# Patient Record
Sex: Male | Born: 1970 | Race: White | Hispanic: No | Marital: Married | Smoking: Never smoker
Health system: Southern US, Community
[De-identification: ages and names within clinical notes are randomized; demographics above are authoritative.]

## PROBLEM LIST (undated history)

## (undated) DIAGNOSIS — H33312 Horseshoe tear of retina without detachment, left eye: Secondary | ICD-10-CM

## (undated) DIAGNOSIS — R3915 Urgency of urination: Secondary | ICD-10-CM

## (undated) DIAGNOSIS — J45909 Unspecified asthma, uncomplicated: Secondary | ICD-10-CM

## (undated) DIAGNOSIS — H3530 Unspecified macular degeneration: Secondary | ICD-10-CM

## (undated) HISTORY — PX: CATARACT EXTRACTION, BILATERAL: SHX1313

## (undated) HISTORY — PX: EYE SURGERY: SHX253

---

## 2009-04-01 ENCOUNTER — Ambulatory Visit: Payer: Self-pay | Admitting: Family Medicine

## 2009-07-09 ENCOUNTER — Encounter: Admission: RE | Admit: 2009-07-09 | Discharge: 2009-07-09 | Payer: Self-pay | Admitting: Family Medicine

## 2009-07-09 ENCOUNTER — Ambulatory Visit: Payer: Self-pay | Admitting: Family Medicine

## 2014-11-20 ENCOUNTER — Other Ambulatory Visit: Payer: Self-pay | Admitting: Ophthalmology

## 2014-12-05 ENCOUNTER — Encounter (HOSPITAL_COMMUNITY): Payer: Self-pay | Admitting: Pharmacy Technician

## 2014-12-09 ENCOUNTER — Other Ambulatory Visit (HOSPITAL_COMMUNITY): Payer: Self-pay | Admitting: *Deleted

## 2014-12-09 NOTE — Pre-Procedure Instructions (Signed)
Christopher Frederick  12/09/2014   Your procedure is scheduled on:  Wednesday, December 17, 2014 at 12:30 PM.   Report to [facility] Entrance "A" Admitting Office at 10:30 AM.   Call this number if you have problems the morning of surgery: [phone]               Any questions prior to day of surgery, please call [phone] between 8 & 4 PM.    Remember:   Do not eat food or drink liquids after midnight Tuesday, 12/17/14.   Take these medicines the morning of surgery with A SIP OF WATER: Allergy med   Do not wear jewelry.  Do not wear lotions, powders, or cologne. You may wear deodorant.  Men may shave face and neck.  Do not bring valuables to the hospital.  [facility] is not responsible                  for any belongings or valuables.               Contacts, dentures or bridgework may not be worn into surgery.  Leave suitcase in the car. After surgery it may be brought to your room.  For patients admitted to the hospital, discharge time is determined by your                treatment team.               Patients discharged the day of surgery will not be allowed to drive home.    Special Instructions: See "Preparing for Surgery" Instruction sheet.     Please read over the following fact sheets that you were given: Pain Booklet, Coughing and Deep Breathing and Surgical Site Infection Prevention

## 2014-12-09 NOTE — Progress Notes (Signed)
Pre-op orders in EPIC not signed. Called Dr. Eliane DecreePatel's office and spoke with receptionist. She states that Dr. Allena KatzPatel is not in the office this week. Will leave a message for him when he returns.

## 2014-12-10 ENCOUNTER — Encounter (HOSPITAL_COMMUNITY)
Admission: RE | Admit: 2014-12-10 | Discharge: 2014-12-10 | Disposition: A | Discharge status: Home / Self Care | Payer: BC Managed Care – PPO | Source: Ambulatory Visit | Attending: Ophthalmology | Admitting: Ophthalmology

## 2014-12-10 ENCOUNTER — Encounter (HOSPITAL_COMMUNITY): Payer: Self-pay

## 2014-12-10 DIAGNOSIS — Z01812 Encounter for preprocedural laboratory examination: Secondary | ICD-10-CM | POA: Diagnosis not present

## 2014-12-10 HISTORY — DX: Unspecified macular degeneration: H35.30

## 2014-12-10 HISTORY — DX: Urgency of urination: R39.15

## 2014-12-10 HISTORY — DX: Horseshoe tear of retina without detachment, left eye: H33.312

## 2014-12-10 HISTORY — DX: Unspecified asthma, uncomplicated: J45.909

## 2014-12-10 LAB — CBC
HCT: 44.4 % (ref 39.0–52.0)
Hemoglobin: 14.7 g/dL (ref 13.0–17.0)
MCH: 29.7 pg (ref 26.0–34.0)
MCHC: 33.1 g/dL (ref 30.0–36.0)
MCV: 89.7 fL (ref 78.0–100.0)
Platelets: 225 10*3/uL (ref 150–400)
RBC: 4.95 MIL/uL (ref 4.22–5.81)
RDW: 13 % (ref 11.5–15.5)
WBC: 10.1 10*3/uL (ref 4.0–10.5)

## 2014-12-10 NOTE — Pre-Procedure Instructions (Signed)
Christopher Frederick  12/10/2014   Your procedure is scheduled on:  Wednesday, December 17, 2014 at 12:30 PM.   Report to [facility] Entrance "A" Admitting Office at 10:30 AM.   Call this number if you have problems the morning of surgery: [phone]               Any questions prior to day of surgery, please call [phone] between 8 & 4 PM.    Remember:   Do not eat food or drink liquids after midnight Tuesday, 12/17/14.   Take these medicines the morning of surgery with A SIP OF WATER: Allergy med, Oxybutynin (Ditropan)   Do not wear jewelry.  Do not wear lotions, powders, or cologne. You may wear deodorant.  Men may shave face and neck.  Do not bring valuables to the hospital.  [facility] is not responsible                  for any belongings or valuables.               Contacts, dentures or bridgework may not be worn into surgery.  Leave suitcase in the car. After surgery it may be brought to your room.  For patients admitted to the hospital, discharge time is determined by your                treatment team.               Patients discharged the day of surgery will not be allowed to drive home.    Special Instructions: [place] - Preparing for Surgery  Before surgery, you can play an important role.  Because skin is not sterile, your skin needs to be as free of germs as possible.  You can reduce the number of germs on you skin by washing with CHG (chlorahexidine gluconate) soap before surgery.  CHG is an antiseptic cleaner which kills germs and bonds with the skin to continue killing germs even after washing.  Please DO NOT use if you have an allergy to CHG or antibacterial soaps.  If your skin becomes reddened/irritated stop using the CHG and inform your nurse when you arrive at Short Stay.  Do not shave (including legs and underarms) for at least 48 hours prior to the first CHG shower.  You may shave your face.  Please follow these instructions carefully:   1.   Shower with CHG Soap the night before surgery and the                                morning of Surgery.  2.  If you choose to wash your hair, wash your hair first as usual with your       normal shampoo.  3.  After you shampoo, rinse your hair and body thoroughly to remove the                      Shampoo.  4.  Use CHG as you would any other liquid soap.  You can apply chg directly       to the skin and wash gently with scrungie or a clean washcloth.  5.  Apply the CHG Soap to your body ONLY FROM THE NECK DOWN.        Do not use on open wounds or open sores.  Avoid contact with your eyes, ears, mouth and  genitals (private parts).  Wash genitals (private parts) with your normal soap.  6.  Wash thoroughly, paying special attention to the area where your surgery        will be performed.  7.  Thoroughly rinse your body with warm water from the neck down.  8.  DO NOT shower/wash with your normal soap after using and rinsing off       the CHG Soap.  9.  Pat yourself dry with a clean towel.            10.  Wear clean pajamas.            11.  Place clean sheets on your bed the night of your first shower and do not        sleep with pets.  Day of Surgery  Do not apply any lotions the morning of surgery.  Please wear clean clothes to the hospital.       Please read over the following fact sheets that you were given: Pain Booklet, Coughing and Deep Breathing and Surgical Site Infection Prevention

## 2014-12-16 ENCOUNTER — Other Ambulatory Visit: Payer: Self-pay | Admitting: Ophthalmology

## 2014-12-17 MED ORDER — PHENYLEPHRINE HCL 2.5 % OP SOLN
1.0000 [drp] | OPHTHALMIC | Status: AC | PRN
  Administered 2014-12-18 (×3): 1 [drp] via OPHTHALMIC
  Filled 2014-12-17: qty 2

## 2014-12-17 MED ORDER — GATIFLOXACIN 0.5 % OP SOLN
1.0000 [drp] | OPHTHALMIC | Status: AC | PRN
  Administered 2014-12-18 (×3): 1 [drp] via OPHTHALMIC
  Filled 2014-12-17: qty 2.5

## 2014-12-17 MED ORDER — CYCLOPENTOLATE HCL 1 % OP SOLN
1.0000 [drp] | OPHTHALMIC | Status: AC | PRN
  Administered 2014-12-18 (×3): 1 [drp] via OPHTHALMIC
  Filled 2014-12-17: qty 2

## 2014-12-17 NOTE — Progress Notes (Signed)
Patient notified to arrive at 11am. Verbalized understanding

## 2014-12-18 ENCOUNTER — Ambulatory Visit (HOSPITAL_COMMUNITY): Payer: BC Managed Care – PPO | Admitting: Critical Care Medicine

## 2014-12-18 ENCOUNTER — Encounter (HOSPITAL_COMMUNITY)
Admission: RE | Disposition: A | Discharge status: Home / Self Care | Payer: Self-pay | Source: Ambulatory Visit | Attending: Ophthalmology

## 2014-12-18 ENCOUNTER — Encounter (HOSPITAL_COMMUNITY): Payer: Self-pay

## 2014-12-18 ENCOUNTER — Ambulatory Visit (HOSPITAL_COMMUNITY)
Admission: RE | Admit: 2014-12-18 | Discharge: 2014-12-18 | Disposition: A | Discharge status: Home / Self Care | Payer: BC Managed Care – PPO | Source: Ambulatory Visit | Attending: Ophthalmology | Admitting: Ophthalmology

## 2014-12-18 DIAGNOSIS — H338 Other retinal detachments: Secondary | ICD-10-CM | POA: Diagnosis not present

## 2014-12-18 DIAGNOSIS — J45909 Unspecified asthma, uncomplicated: Secondary | ICD-10-CM | POA: Insufficient documentation

## 2014-12-18 DIAGNOSIS — H3530 Unspecified macular degeneration: Secondary | ICD-10-CM | POA: Diagnosis not present

## 2014-12-18 DIAGNOSIS — H3322 Serous retinal detachment, left eye: Secondary | ICD-10-CM

## 2014-12-18 HISTORY — PX: GAS INSERTION: SHX5336

## 2014-12-18 HISTORY — PX: PERFLUORONE INJECTION: SHX5302

## 2014-12-18 HISTORY — PX: GAS/FLUID EXCHANGE: SHX5334

## 2014-12-18 HISTORY — PX: PARS PLANA VITRECTOMY: SHX2166

## 2014-12-18 SURGERY — PARS PLANA VITRECTOMY WITH 25 GAUGE
Anesthesia: General | Site: Eye | Laterality: Left

## 2014-12-18 MED ORDER — BSS PLUS IO SOLN
INTRAOCULAR | Status: AC
  Filled 2014-12-18: qty 500

## 2014-12-18 MED ORDER — GLYCOPYRROLATE 0.2 MG/ML IJ SOLN
INTRAMUSCULAR | Status: DC | PRN
  Administered 2014-12-18: 0.4 mg via INTRAVENOUS

## 2014-12-18 MED ORDER — ONDANSETRON HCL 4 MG/2ML IJ SOLN
INTRAMUSCULAR | Status: DC | PRN
  Administered 2014-12-18: 4 mg via INTRAVENOUS

## 2014-12-18 MED ORDER — ONDANSETRON HCL 4 MG/2ML IJ SOLN: 4.0000 mg | Freq: Once | INTRAMUSCULAR | Status: AC | PRN

## 2014-12-18 MED ORDER — FENTANYL CITRATE 0.05 MG/ML IJ SOLN
INTRAMUSCULAR | Status: DC | PRN
  Administered 2014-12-18: 100 ug via INTRAVENOUS
  Administered 2014-12-18: 150 ug via INTRAVENOUS
  Administered 2014-12-18 (×3): 50 ug via INTRAVENOUS
  Administered 2014-12-18: 100 ug via INTRAVENOUS

## 2014-12-18 MED ORDER — DEXAMETHASONE SODIUM PHOSPHATE 4 MG/ML IJ SOLN
INTRAMUSCULAR | Status: DC | PRN
  Administered 2014-12-18: 4 mg via INTRAVENOUS

## 2014-12-18 MED ORDER — HYPROMELLOSE (GONIOSCOPIC) 2.5 % OP SOLN: OPHTHALMIC | Status: DC | PRN

## 2014-12-18 MED ORDER — 0.9 % SODIUM CHLORIDE (POUR BTL) OPTIME
TOPICAL | Status: DC | PRN
  Administered 2014-12-18: 200 mL

## 2014-12-18 MED ORDER — LACTATED RINGERS IV SOLN
INTRAVENOUS | Status: DC | PRN
  Administered 2014-12-18 (×3): via INTRAVENOUS

## 2014-12-18 MED ORDER — BSS IO SOLN
INTRAOCULAR | Status: AC
  Filled 2014-12-18: qty 15

## 2014-12-18 MED ORDER — EPINEPHRINE HCL 1 MG/ML IJ SOLN
INTRAMUSCULAR | Status: AC
  Filled 2014-12-18: qty 1

## 2014-12-18 MED ORDER — ACETAZOLAMIDE SODIUM 500 MG IJ SOLR
INTRAMUSCULAR | Status: AC
  Filled 2014-12-18: qty 500

## 2014-12-18 MED ORDER — TRIAMCINOLONE ACETONIDE 40 MG/ML IJ SUSP
INTRAMUSCULAR | Status: AC
  Filled 2014-12-18: qty 5

## 2014-12-18 MED ORDER — NEOSTIGMINE METHYLSULFATE 10 MG/10ML IV SOLN
INTRAVENOUS | Status: AC
  Filled 2014-12-18: qty 1

## 2014-12-18 MED ORDER — BSS IO SOLN
INTRAOCULAR | Status: DC | PRN
  Administered 2014-12-18: 15 mL via INTRAOCULAR

## 2014-12-18 MED ORDER — FENTANYL CITRATE 0.05 MG/ML IJ SOLN
INTRAMUSCULAR | Status: AC
  Filled 2014-12-18: qty 5

## 2014-12-18 MED ORDER — LIDOCAINE HCL (CARDIAC) 20 MG/ML IV SOLN
INTRAVENOUS | Status: DC | PRN
  Administered 2014-12-18: 80 mg via INTRAVENOUS

## 2014-12-18 MED ORDER — MIDAZOLAM HCL 2 MG/2ML IJ SOLN
INTRAMUSCULAR | Status: AC
  Filled 2014-12-18: qty 2

## 2014-12-18 MED ORDER — DEXAMETHASONE SODIUM PHOSPHATE 10 MG/ML IJ SOLN
INTRAMUSCULAR | Status: AC
  Filled 2014-12-18: qty 1

## 2014-12-18 MED ORDER — FENTANYL CITRATE 0.05 MG/ML IJ SOLN: 25.0000 ug | INTRAMUSCULAR | Status: DC | PRN

## 2014-12-18 MED ORDER — STERILE WATER FOR INJECTION IJ SOLN
INTRAMUSCULAR | Status: DC | PRN
  Administered 2014-12-18: 200 mL

## 2014-12-18 MED ORDER — BSS PLUS IO SOLN
INTRAOCULAR | Status: DC | PRN
  Administered 2014-12-18: 500 mL

## 2014-12-18 MED ORDER — MIDAZOLAM HCL 5 MG/5ML IJ SOLN
INTRAMUSCULAR | Status: DC | PRN
  Administered 2014-12-18: 2 mg via INTRAVENOUS

## 2014-12-18 MED ORDER — POLYMYXIN B SULFATE 500000 UNITS IJ SOLR
INTRAMUSCULAR | Status: AC
  Filled 2014-12-18: qty 1

## 2014-12-18 MED ORDER — SUCCINYLCHOLINE CHLORIDE 20 MG/ML IJ SOLN
INTRAMUSCULAR | Status: DC | PRN
  Administered 2014-12-18: 100 mg via INTRAVENOUS

## 2014-12-18 MED ORDER — LIDOCAINE HCL 2 % IJ SOLN
INTRAMUSCULAR | Status: AC
Start: 2014-12-18 — End: 2014-12-18
  Filled 2014-12-18: qty 20

## 2014-12-18 MED ORDER — NEOSTIGMINE METHYLSULFATE 10 MG/10ML IV SOLN
INTRAVENOUS | Status: DC | PRN
  Administered 2014-12-18: 3 mg via INTRAVENOUS

## 2014-12-18 MED ORDER — BUPIVACAINE HCL (PF) 0.75 % IJ SOLN
INTRAMUSCULAR | Status: AC
  Filled 2014-12-18: qty 10

## 2014-12-18 MED ORDER — HYPROMELLOSE (GONIOSCOPIC) 2.5 % OP SOLN
OPHTHALMIC | Status: AC
  Filled 2014-12-18: qty 15

## 2014-12-18 MED ORDER — OXYCODONE HCL 5 MG PO TABS
5.0000 mg | ORAL_TABLET | Freq: Once | ORAL | Status: AC | PRN
  Administered 2014-12-18: 5 mg via ORAL

## 2014-12-18 MED ORDER — ROCURONIUM BROMIDE 100 MG/10ML IV SOLN
INTRAVENOUS | Status: DC | PRN
  Administered 2014-12-18: 10 mg via INTRAVENOUS
  Administered 2014-12-18: 20 mg via INTRAVENOUS

## 2014-12-18 MED ORDER — SODIUM CHLORIDE 0.9 % IJ SOLN
INTRAMUSCULAR | Status: AC
Start: 2014-12-18 — End: 2014-12-18
  Filled 2014-12-18: qty 10

## 2014-12-18 MED ORDER — PROPOFOL 10 MG/ML IV BOLUS
INTRAVENOUS | Status: DC | PRN
  Administered 2014-12-18: 200 mg via INTRAVENOUS

## 2014-12-18 MED ORDER — HYALURONIDASE HUMAN 150 UNIT/ML IJ SOLN
INTRAMUSCULAR | Status: AC
  Filled 2014-12-18: qty 1

## 2014-12-18 MED ORDER — OXYCODONE HCL 5 MG/5ML PO SOLN: 5.0000 mg | Freq: Once | ORAL | Status: AC | PRN

## 2014-12-18 MED ORDER — HYPROMELLOSE (GONIOSCOPIC) 2.5 % OP SOLN
OPHTHALMIC | Status: DC | PRN
  Administered 2014-12-18: 5 [drp] via OPHTHALMIC

## 2014-12-18 MED ORDER — GLYCOPYRROLATE 0.2 MG/ML IJ SOLN
INTRAMUSCULAR | Status: AC
  Filled 2014-12-18: qty 2

## 2014-12-18 MED ORDER — SODIUM HYALURONATE 10 MG/ML IO SOLN
INTRAOCULAR | Status: AC
  Filled 2014-12-18: qty 0.85

## 2014-12-18 MED ORDER — OXYCODONE HCL 5 MG PO TABS
ORAL_TABLET | ORAL | Status: AC
  Filled 2014-12-18: qty 1

## 2014-12-18 MED ORDER — MEPERIDINE HCL 25 MG/ML IJ SOLN: 6.2500 mg | INTRAMUSCULAR | Status: DC | PRN

## 2014-12-18 MED ORDER — BACITRACIN-POLYMYXIN B 500-10000 UNIT/GM OP OINT
TOPICAL_OINTMENT | OPHTHALMIC | Status: AC
  Filled 2014-12-18: qty 3.5

## 2014-12-18 MED ORDER — ATROPINE SULFATE 1 % OP SOLN
OPHTHALMIC | Status: AC
  Filled 2014-12-18: qty 5

## 2014-12-18 MED ORDER — DEXAMETHASONE SODIUM PHOSPHATE 10 MG/ML IJ SOLN
INTRAMUSCULAR | Status: DC | PRN
  Administered 2014-12-18: 10 mg

## 2014-12-18 MED ORDER — GENTAMICIN SULFATE 40 MG/ML IJ SOLN
INTRAMUSCULAR | Status: AC
  Filled 2014-12-18: qty 2

## 2014-12-18 SURGICAL SUPPLY — 75 items
APPLICATOR COTTON TIP 6IN STRL (MISCELLANEOUS) ×3 IMPLANT
APPLICATOR DR MATTHEWS STRL (MISCELLANEOUS) ×9 IMPLANT
BLADE MINI 60D BLUE (BLADE) IMPLANT
BLADE MINI RND TIP GREEN BEAV (BLADE) IMPLANT
BLADE MVR KNIFE 20G (BLADE) IMPLANT
CANNULA ANT CHAM MAIN (OPHTHALMIC RELATED) IMPLANT
CANNULA DUAL BORE 23G (CANNULA) IMPLANT
CANNULA VLV SOFT TIP 25GA (OPHTHALMIC) ×6 IMPLANT
CAUTERY EYE LOW TEMP 1300F FIN (OPHTHALMIC RELATED) IMPLANT
CONT SPEC 4OZ CLIKSEAL STRL BL (MISCELLANEOUS) ×3 IMPLANT
CORDS BIPOLAR (ELECTRODE) IMPLANT
COVER MAYO STAND STRL (DRAPES) ×3 IMPLANT
COVER SURGICAL LIGHT HANDLE (MISCELLANEOUS) IMPLANT
DRAPE INCISE 51X51 W/FILM STRL (DRAPES) ×3 IMPLANT
DRAPE PROXIMA HALF (DRAPES) ×3 IMPLANT
DRAPE RETRACTOR (MISCELLANEOUS) ×3 IMPLANT
ERASER HMR WETFIELD 23G BP (MISCELLANEOUS) IMPLANT
FILTER BLUE MILLIPORE (MISCELLANEOUS) ×6 IMPLANT
FILTER STRAW FLUID ASPIR (MISCELLANEOUS) IMPLANT
FORCEPS ECKARDT ILM 25G SERR (OPHTHALMIC RELATED) IMPLANT
FORCEPS GRIESHABER ILM 25G A (INSTRUMENTS) IMPLANT
GAS AUTO FILL CONSTEL (OPHTHALMIC)
GAS AUTO FILL CONSTELLATION (OPHTHALMIC) IMPLANT
GAS ISPAN SULFUR HEXAFLUORIDE (MISCELLANEOUS) ×3 IMPLANT
GAS OPHTHALMIC (MISCELLANEOUS) IMPLANT
GLOVE BIO SURGEON STRL SZ7.5 (GLOVE) ×3 IMPLANT
GLOVE BIOGEL PI IND STRL 7.5 (GLOVE) ×2 IMPLANT
GLOVE BIOGEL PI INDICATOR 7.5 (GLOVE) ×1
GLOVE SURG SS PI 7.0 STRL IVOR (GLOVE) ×3 IMPLANT
GOWN STRL REUS W/ TWL LRG LVL3 (GOWN DISPOSABLE) ×4 IMPLANT
GOWN STRL REUS W/TWL LRG LVL3 (GOWN DISPOSABLE) ×2
HANDLE PNEUMATIC FOR CONSTEL (OPHTHALMIC) IMPLANT
KIT BASIN OR (CUSTOM PROCEDURE TRAY) ×3 IMPLANT
KIT PERFLUORON PROCEDURE 5ML (MISCELLANEOUS) ×3 IMPLANT
KIT ROOM TURNOVER OR (KITS) ×3 IMPLANT
LENS BIOM SUPER VIEW SET DISP (OPHTHALMIC RELATED) ×3 IMPLANT
MICROPICK 25G (MISCELLANEOUS)
NEEDLE 18GX1X1/2 (RX/OR ONLY) (NEEDLE) ×3 IMPLANT
NEEDLE 25GX 5/8IN NON SAFETY (NEEDLE) ×3 IMPLANT
NEEDLE FILTER BLUNT 18X 1/2SAF (NEEDLE) ×1
NEEDLE FILTER BLUNT 18X1 1/2 (NEEDLE) ×2 IMPLANT
NEEDLE HYPO 25GX1X1/2 BEV (NEEDLE) IMPLANT
NEEDLE HYPO 30X.5 LL (NEEDLE) IMPLANT
NEEDLE RETROBULBAR 25GX1.5 (NEEDLE) ×3 IMPLANT
NS IRRIG 1000ML POUR BTL (IV SOLUTION) ×3 IMPLANT
PACK VITRECTOMY CUSTOM (CUSTOM PROCEDURE TRAY) ×3 IMPLANT
PAD ARMBOARD 7.5X6 YLW CONV (MISCELLANEOUS) ×3 IMPLANT
PAK PIK VITRECTOMY CVS 25GA (OPHTHALMIC) ×3 IMPLANT
PAK VITRECTOMY PIK 25 GA (OPHTHALMIC RELATED) IMPLANT
PENCIL BIPOLAR 25GA STR DISP (OPHTHALMIC RELATED) IMPLANT
PICK MICROPICK 25G (MISCELLANEOUS) IMPLANT
PROBE LASER ILLUM FLEX CVD 23G (OPHTHALMIC) IMPLANT
PROBE LASER ILLUM FLEX CVD 25G (OPHTHALMIC) ×3 IMPLANT
REPL STRA BRUSH NEEDLE (NEEDLE) ×3 IMPLANT
RESERVOIR BACK FLUSH (MISCELLANEOUS) ×3 IMPLANT
RETRACTOR IRIS FLEX 25G GRIESH (INSTRUMENTS) IMPLANT
ROLLS DENTAL (MISCELLANEOUS) ×6 IMPLANT
SCRAPER DIAMOND 25GA (OPHTHALMIC RELATED) IMPLANT
SET FLUID INJECTOR (SET/KITS/TRAYS/PACK) IMPLANT
SHEET MEDIUM DRAPE 40X70 STRL (DRAPES) ×3 IMPLANT
STOCKINETTE IMPERVIOUS 9X36 MD (GAUZE/BANDAGES/DRESSINGS) IMPLANT
STOPCOCK 4 WAY LG BORE MALE ST (IV SETS) IMPLANT
SUT ETHILON 5.0 S-24 (SUTURE) IMPLANT
SUT SILK 2 0 (SUTURE)
SUT SILK 2-0 18XBRD TIE 12 (SUTURE) IMPLANT
SUT VICRYL 7 0 TG140 8 (SUTURE) IMPLANT
SUT VICRYL 8 0 TG140 8 (SUTURE) ×3 IMPLANT
SYR 20CC LL (SYRINGE) ×3 IMPLANT
SYR 5ML LL (SYRINGE) ×3 IMPLANT
SYR TB 1ML LUER SLIP (SYRINGE) ×3 IMPLANT
SYRINGE 10CC LL (SYRINGE) ×3 IMPLANT
SYRINGE 60CC LL (MISCELLANEOUS) ×3 IMPLANT
TOWEL OR 17X24 6PK STRL BLUE (TOWEL DISPOSABLE) IMPLANT
WATER STERILE IRR 1000ML POUR (IV SOLUTION) ×3 IMPLANT
WIPE INSTRUMENT VISIWIPE 73X73 (MISCELLANEOUS) IMPLANT

## 2014-12-18 NOTE — Anesthesia Preprocedure Evaluation (Addendum)
Anesthesia Evaluation  Patient identified by MRN, date of birth, ID band Patient awake    Reviewed: Allergy & Precautions, NPO status , Patient's Chart, lab work & pertinent test results  Airway Mallampati: I  TM Distance: >3 FB Neck ROM: Full    Dental  (+)    Pulmonary asthma ,  breath sounds clear to auscultation        Cardiovascular negative cardio ROS  Rhythm:Regular Rate:Normal     Neuro/Psych negative neurological ROS  negative psych ROS   GI/Hepatic negative GI ROS, Neg liver ROS,   Endo/Other  negative endocrine ROS  Renal/GU negative Renal ROS   Urinary urgency     Musculoskeletal negative musculoskeletal ROS (+)   Abdominal   Peds  Hematology negative hematology ROS (+)   Anesthesia Other Findings   Reproductive/Obstetrics                            Anesthesia Physical Anesthesia Plan  ASA: II  Anesthesia Plan: General   Post-op Pain Management:    Induction:   Airway Management Planned: Oral ETT  Additional Equipment:   Intra-op Plan:   Post-operative Plan: Extubation in OR  Informed Consent: I have reviewed the patients History and Physical, chart, labs and discussed the procedure including the risks, benefits and alternatives for the proposed anesthesia with the patient or authorized representative who has indicated his/her understanding and acceptance.   Dental advisory given  Plan Discussed with: CRNA and Anesthesiologist  Anesthesia Plan Comments:        Anesthesia Quick Evaluation

## 2014-12-18 NOTE — Anesthesia Procedure Notes (Signed)
Procedure Name: Intubation Date/Time: 12/18/2014 1:12 PM Performed by: Glo HerringLEE, [name] Pre-anesthesia Checklist: Patient identified, Timeout performed, Emergency Drugs available, Suction available and Patient being monitored Patient Re-evaluated:Patient Re-evaluated prior to inductionOxygen Delivery Method: Circle system utilized Preoxygenation: Pre-oxygenation with 100% oxygen Intubation Type: IV induction Ventilation: Mask ventilation without difficulty Laryngoscope Size: Mac and 4 Grade View: Grade I Tube type: Oral Tube size: 8.0 mm Number of attempts: 1 Airway Equipment and Method: Stylet Placement Confirmation: CO2 detector,  positive ETCO2,  ETT inserted through vocal cords under direct vision and breath sounds checked- equal and bilateral Secured at: 24 cm Tube secured with: Tape Dental Injury: Teeth and Oropharynx as per pre-operative assessment

## 2014-12-18 NOTE — H&P (Signed)
.   Date: 12/18/2014  MRN:  [id number] Name:  Christopher Frederick Sex:  male Age:  44 y.o. DOB:25-Dec-1970   PSC #:                       Facility/Room; Level Of Care: Provider:   Emergency Contacts: Contact Information    Name Relation Home Work Mobile   [place] Spouse [phone]        Code Status: MOST Form:  Allergies:No Known Allergies   No chief complaint on file.    HPI:  Past Medical History  Diagnosis Date  . Asthma     as a child  . Macular degeneration   . Retinal tear of left eye   . Urinary urgency     Takes Ditropan    Past Surgical History  Procedure Laterality Date  . Eye surgery Left      Procedures: Consultants:  No current facility-administered medications for this encounter.   Facility-Administered Medications Ordered in Other Encounters  Medication Dose Route Frequency Provider Last Rate Last Dose  . lactated ringers infusion    Continuous PRN Rachel MouldsHeather B Lee, CRNA         There is no immunization history on file for this patient.   Diet:  History  Substance Use Topics  . Smoking status: Never Smoker   . Smokeless tobacco: Never Used  . Alcohol Use: 3.6 oz/week    6 Glasses of wine per week    Family History  Problem Relation Age of Onset  . Crohn's disease Father      Pertinent items are noted in HPI.  Vital signs: BP 137/71 mmHg  Pulse 65  Temp(Src) 97.3 F (36.3 C) (Oral)  Resp 20  Ht 6\' 6"  (1.981 m)  Wt 108.863 kg (240 lb)  BMI 27.74 kg/m2  SpO2 99%  History of retinal detachment left eye with residual SRF inferiorly.  No change in health status since last exam Vision 20/80 OS  Anterior segment exam - normal Posterior segment exam - lattice right eye, Scleral buckle effect left eye with min SRF inferior retina     Plan: Retinal detachment repair with scleral buckle removal, endolaser, and placement of silicone oil.

## 2014-12-18 NOTE — Discharge Instructions (Signed)
Do not sleep on back. Sleep on right side with nose pointed to pillow.  Take Diamox 500 Sequel tonight and then BID for the next 4 days.

## 2014-12-18 NOTE — Brief Op Note (Signed)
12/18/2014  5:04 PM  PATIENT:  Christopher Frederick  44 y.o. 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  PROCEDURE:  Procedure(s) with comments: VITRECTOMY 25 GAUGE WITH REMOVAL OF SCLERAL BUCKLE (Left) PERFLUORONE INJECTION (Left) INSERTION OF GAS (Left) - SF6 GAS/FLUID EXCHANGE (Left)  SURGEON:  Surgeon(s) and Role:    * Carmela RimaNarendra Jaquelyne Firkus, MD - Primary  PHYSICIAN ASSISTANT:   ASSISTANTS: none   ANESTHESIA:   general  EBL:  Total I/O In: 1000 [I.V.:1000] Out: -   BLOOD ADMINISTERED:none  DRAINS: none   LOCAL MEDICATIONS USED:  NONE  SPECIMEN:  No Specimen and Source of Specimen:  scleral buckle  DISPOSITION OF SPECIMEN:  PATHOLOGY  COUNTS:  YES  TOURNIQUET:  * No tourniquets in log *  DICTATION: .Note written in EPIC  PLAN OF CARE: Discharge to home after PACU  PATIENT DISPOSITION:  PACU - hemodynamically stable.   Delay start of Pharmacological VTE agent (>24hrs) due to surgical blood loss or risk of bleeding: no

## 2014-12-18 NOTE — Anesthesia Postprocedure Evaluation (Signed)
  Anesthesia Post-op Note  Patient: Christopher Frederick  Procedure(s) Performed: Procedure(s) with comments: VITRECTOMY 25 GAUGE WITH REMOVAL OF SCLERAL BUCKLE (Left) PERFLUORONE INJECTION (Left) INSERTION OF GAS (Left) - SF6 GAS/FLUID EXCHANGE (Left)  Patient Location: PACU  Anesthesia Type:General  Level of Consciousness: awake, alert  and oriented  Airway and Oxygen Therapy: Patient Spontanous Breathing  Post-op Pain: 2 /10  Post-op Assessment: Post-op Vital signs reviewed, Patient's Cardiovascular Status Stable, Respiratory Function Stable, Patent Airway and No signs of Nausea or vomiting  Post-op Vital Signs: Reviewed and stable  Last Vitals:  Filed Vitals:   12/18/14 1630  BP: 145/78  Pulse: 85  Temp:   Resp: 11    Complications: No apparent anesthesia complications

## 2014-12-18 NOTE — Transfer of Care (Signed)
Immediate Anesthesia Transfer of Care Note  Patient: Christopher Frederick  Procedure(s) Performed: Procedure(s) with comments: VITRECTOMY 25 GAUGE WITH REMOVAL OF SCLERAL BUCKLE (Left) PERFLUORONE INJECTION (Left) INSERTION OF GAS (Left) - SF6 GAS/FLUID EXCHANGE (Left)  Patient Location: PACU  Anesthesia Type:General  Level of Consciousness: sedated, patient cooperative and responds to stimulation  Airway & Oxygen Therapy: Patient Spontanous Breathing and Patient connected to nasal cannula oxygen  Post-op Assessment: Report given to RN, Post -op Vital signs reviewed and stable and Patient moving all extremities  Post vital signs: Reviewed and stable  Last Vitals:  Filed Vitals:   12/18/14 1047  BP: 137/71  Pulse: 65  Temp: 36.3 C  Resp: 20    Complications: No apparent anesthesia complications

## 2014-12-19 ENCOUNTER — Encounter (HOSPITAL_COMMUNITY): Payer: Self-pay | Admitting: Ophthalmology

## 2014-12-19 NOTE — Op Note (Signed)
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  Time out confirmed the correct operative eye as the left eye. The  patient was prepped and draped in the usual fashion for ocular surgery on the  left eye .  A lid speculum was placed.  Infusion line and trocar was placed at the 4 o'clock position approximately 4 mm from the surgical limbus.   The infusion line was allowed to run and then clamped when placed at the cannula opening. The line was inserted and secured to the drape with an adhesive strip.     The scleral buckle was identified and conjunctiva and tenons was dissected in the inferotemporal quadrant over the buckle and band attachment.  The band was incised and the buckle with band was removed without complication.  No evidence of inflammation or infection was noted at the buckle site.  Active trocars/cannula were placed at the 10 and 2 o'clock positions approximately 4 mm from the surgical limbus. The cannula was visualized in the vitreous cavity.  The light pipe and vitreous cutter were inserted into the vitreous cavity and a core vitrectomy was performed.  Care taken to remove the vitreous up to the vitreous base for 360 degrees.  Vitrectomy was carefully performed over the inferior tear being careful to relieve any traction.  Perflouron was added to ensure the retina remained flat.  Following careful peripheral vitrectomy and not noting any movement of the peripheral retina, 3 rows of endolaser were applied 360 degrees to the periphery including the area around the inferior tear.  An air-fluid exchange was performed to remove the perflouron and irrigating  fluid.  Additional endolaser was applied to the periphery.  20% SF6 gas was infused through the eye after the superonasal trocar was removed and noted to be airtight.  The temporal trocars were sequentially removed and the sclerotomies sutured closed after the conjunctiva and tenons was dissected over the sclerotomy.  Additional SF6 gas was placed in the eye to ensure the wounds were airtight and the intraocular pressure was normal.  Conjuntiva was reapproximated to the limbus using 8-0 vicryl suture.  8-0 Vicryl suture was also used to closed the inferotemporal incision through conjuctiva and tenons from which the scleral buckle was removed.  Subconjunctival injections of  Dexamethasone 4mg /631ml was placed in the infero-medial quadrant.   The speculum and drapes were removed and the eye was patched with Polymixin/Bacitracin ophthalmic ointment. An eye shield was placed and the patient was transferred alert and conversant with stable vital signs to the post operative recovery area.  The patient tolerated the procedure well and no complications were noted.  Harrold DonathPatel,Patirica Longshore Mafabhai MD

## 2014-12-20 LAB — EYE CULTURE: Culture: NO GROWTH

## 2015-09-11 ENCOUNTER — Ambulatory Visit (INDEPENDENT_AMBULATORY_CARE_PROVIDER_SITE_OTHER): Payer: BC Managed Care – PPO | Admitting: Family Medicine

## 2015-09-11 ENCOUNTER — Encounter: Payer: Self-pay | Admitting: Family Medicine

## 2015-09-11 VITALS — BP 120/78 | HR 64 | Ht 77.25 in | Wt 237.8 lb

## 2015-09-11 DIAGNOSIS — M545 Low back pain, unspecified: Secondary | ICD-10-CM

## 2015-09-11 NOTE — Patient Instructions (Signed)
Keep doing the heat and stretching as well as 2 Aleve twice per day and set up an appointment to see Vassie Momentamien Rudolfo

## 2015-09-11 NOTE — Progress Notes (Signed)
   Subjective:    Patient ID: Christopher Frederick, male    DOB: 12/12/1970, 45 y.o.   MRN: [id number]  HPI  [place] December 11 [place], he had the abrupt onset of right-sided low back pain with radiation into the posterior thigh area. It is been intermittent since then. He has been doing stretching as well as heat and having stem done on this but still has difficulty with this. He also is been taking Aleve 800 twice a day on occasion. He has a previous history of difficulty with low back pain and has had physical therapy. Apparently he was told he did have sciatic problems in the past. He also was told that he has restricted right hip motion.   Review of Systems     Objective:   Physical Exam  normal lumbar curve and motion of his back. Slight tenderness over the right upper SI joint and to a lesser extent over the sciatic notch. Pearlean BrownieFaber and stork test was negative. Negative straight leg raising. Normal DTRs and strength. Hip motion normal.       Assessment & Plan:  Right-sided low back pain without sciatica  I explained that his symptoms are not truly diagnostic availing a particular but I do not believe he is having a herniated disc. This could be combination of SI joint, possible sciatic notch and hip issues. Encouraged him to continue with heat, stretching, stem and he will also see Dr. Darrick Pennaamien Rodolfo. Recommend he switch to 2 Aleve twice per day.

## 2016-05-17 ENCOUNTER — Other Ambulatory Visit: Payer: Self-pay | Admitting: Family Medicine

## 2016-05-17 NOTE — Telephone Encounter (Signed)
Is this okay to refill? 

## 2016-08-12 ENCOUNTER — Other Ambulatory Visit: Payer: Self-pay | Admitting: Family Medicine

## 2016-11-11 ENCOUNTER — Other Ambulatory Visit: Payer: Self-pay | Admitting: Family Medicine

## 2019-03-21 ENCOUNTER — Ambulatory Visit (INDEPENDENT_AMBULATORY_CARE_PROVIDER_SITE_OTHER): Payer: BC Managed Care – PPO | Admitting: Orthopedic Surgery

## 2019-03-21 ENCOUNTER — Ambulatory Visit: Payer: Self-pay

## 2019-03-21 ENCOUNTER — Other Ambulatory Visit: Payer: Self-pay

## 2019-03-21 ENCOUNTER — Encounter: Payer: Self-pay | Admitting: Orthopedic Surgery

## 2019-03-21 DIAGNOSIS — M545 Low back pain, unspecified: Secondary | ICD-10-CM

## 2019-03-21 NOTE — Progress Notes (Signed)
Office Visit Note   Patient: Christopher Frederick           Date of Birth: 01-17-1971           MRN: [id number] Visit Date: 03/21/2019 Requested by: Denita Lung, MD [place],  [place] [address] PCP: Denita Lung, MD  Subjective: Chief Complaint  Patient presents with  . Lower Back - Pain    HPI: Izora Gala is a patient with low back pain and right posterior back pain of long duration.  Is been going on for years but 6 months ago he had an incapacitating episode where he actually collapsed and hobbled into the training room.  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  He does report some right leg pain along with numbness and tingling in the right-hand side with some stiffness and tightness.  Also some calf issues with deep pain.  No real symptoms on the left-hand side.  The morning is worse with significant stiffness but it does get okay throughout the day.  6 months ago he had 1 bad episode in 6 weeks ago he had a second episode.  Went to get chiropractic treatment and also took anti-inflammatories and Tylenol which did help but still he has symptoms on a daily basis worse in the morning with continued right leg radicular symptoms.  Reports he denies any fevers and chills and denies any bowel or bladder symptoms.              ROS: All systems reviewed are negative as they relate to the chief complaint within the history of present illness.  Patient denies  fevers or chills.   Assessment & Plan: Visit Diagnoses:  1. Low back pain, unspecified back pain laterality, unspecified chronicity, unspecified whether sciatica present     Plan: Impression is low back pain with right-sided radiculopathy with slight scoliosis on plain radiographs.  Sounds like he may have either some facet arthritis and foraminal stenosis or potentially a foraminal disc herniation giving him trouble.  Based on duration of  symptoms as well as failure of conservative management and treatment I would favor MRI lumbar spine to evaluate right-sided radiculopathy with possible ESI to follow.  Come back after that study.  Continue with stretching although he is pretty limber on examination today.  Follow-Up Instructions: Return for after MRI.   Orders:  Orders Placed This Encounter  Procedures  . XR Lumbar Spine 2-3 Views  . MR Lumbar Spine w/o contrast   No orders of the defined types were placed in this encounter.     Procedures: No procedures performed   Clinical Data: No additional findings.  Objective: Vital Signs: There were no vitals taken for this visit.  Physical Exam:   Constitutional: Patient appears well-developed HEENT:  Head: Normocephalic Eyes:EOM are normal Neck: Normal range of motion Cardiovascular: Normal rate Pulmonary/chest: Effort normal Neurologic: Patient is alert Skin: Skin is warm Psychiatric: Patient has normal mood and affect    Ortho Exam: Ortho exam demonstrates full active and passive range of motion of the knees hips and ankles.  Mildly positive nerve root tension signs on the right negative on the left.  No paresthesias L1 S1 bilaterally reflexes symmetric 0 to 1+ out of 4 bilateral patella and Achilles.  No no pain with forward lateral bending.  No masses lymphadenopathy or skin changes noted in that back region.  Does have some sciatic  notch tenderness to deep palpation on the right but not the left.  Specialty Comments:  No specialty comments available.  Imaging: Xr Lumbar Spine 2-3 Views  Result Date: 03/21/2019 AP lateral lumbar spine reviewed.  Patient has slight scoliosis in the lumbar region.  Visualized hips without arthritis.  No spondylolisthesis or compression fractures.    PMFS History: There are no active problems to display for this patient.  Past Medical History:  Diagnosis Date  . Asthma    as a child  . Macular degeneration   .  Retinal tear of left eye   . Urinary urgency    Takes Ditropan    Family History  Problem Relation Age of Onset  . Crohn's disease Father     Past Surgical History:  Procedure Laterality Date  . EYE SURGERY Left   . GAS INSERTION Left 12/18/2014   Procedure: INSERTION OF GAS;  Surgeon: Carmela RimaNarendra Patel, MD;  [facility];  Service: Ophthalmology;  Laterality: Left;  SF6  . GAS/FLUID EXCHANGE Left 12/18/2014   Procedure: GAS/FLUID EXCHANGE;  Surgeon: Carmela RimaNarendra Patel, MD;  [facility];  Service: Ophthalmology;  Laterality: Left;  . PARS PLANA VITRECTOMY Left 12/18/2014   Procedure: VITRECTOMY 25 GAUGE WITH REMOVAL OF SCLERAL BUCKLE;  Surgeon: Carmela RimaNarendra Patel, MD;  [facility];  Service: Ophthalmology;  Laterality: Left;  . PERFLUORONE INJECTION Left 12/18/2014   Procedure: PERFLUORONE INJECTION;  Surgeon: Carmela RimaNarendra Patel, MD;  [facility];  Service: Ophthalmology;  Laterality: Left;   Social History   Occupational History  . Not on file  Tobacco Use  . Smoking status: Never Smoker  . Smokeless tobacco: Never Used  Substance and Sexual Activity  . Alcohol use: Yes    Alcohol/week: 6.0 standard drinks    Types: 6 Glasses of wine per week  . Drug use: No  . Sexual activity: Not on file

## 2019-03-22 ENCOUNTER — Other Ambulatory Visit: Payer: Self-pay | Admitting: Family Medicine

## 2019-03-22 DIAGNOSIS — R1012 Left upper quadrant pain: Secondary | ICD-10-CM

## 2019-03-30 ENCOUNTER — Ambulatory Visit (INDEPENDENT_AMBULATORY_CARE_PROVIDER_SITE_OTHER): Payer: BC Managed Care – PPO | Admitting: Orthopedic Surgery

## 2019-03-30 ENCOUNTER — Encounter: Payer: Self-pay | Admitting: Orthopedic Surgery

## 2019-03-30 ENCOUNTER — Ambulatory Visit: Payer: BC Managed Care – PPO | Admitting: Orthopedic Surgery

## 2019-03-30 ENCOUNTER — Telehealth: Payer: Self-pay | Admitting: Physical Medicine and Rehabilitation

## 2019-03-30 DIAGNOSIS — M545 Low back pain, unspecified: Secondary | ICD-10-CM

## 2019-03-30 NOTE — Progress Notes (Signed)
Office Visit Note   Patient: Christopher Frederick           Date of Birth: February 12, 1971           MRN: [id number] Visit Date: 03/30/2019 Requested by: Denita Lung, MD [place],  [place] [address] PCP: Denita Lung, MD  Subjective: Chief Complaint  Patient presents with  . Follow-up    HPI: Izora Gala is here for review of his MRI scan L-spine.  He is having some whole leg symptoms at this time which are new for him.  Runs down the toes on the right leg.  He still is able to play golf.  MRI scan shows right-sided posterior lateral disc protrusion at L4-5 with foraminal stenosis.  That scan is reviewed with the patient              ROS: All systems reviewed are negative as they relate to the chief complaint within the history of present illness.  Patient denies  fevers or chills.   Assessment & Plan: Visit Diagnoses: No diagnosis found.  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  Plan is referred to Dr. Ernestina Patches for right-sided L5-S1 foraminal injection.  Also want J to work with him at [facility] on core strengthening and hamstring stretching.  Does not look like this is a surgical problem.  Follow-Up Instructions: No follow-ups on file.   Orders:  No orders of the defined types were placed in this encounter.  No orders of the defined types were placed in this encounter.     Procedures: No procedures performed   Clinical Data: No additional findings.  Objective: Vital Signs: There were no vitals taken for this visit.  Physical Exam:   Constitutional: Patient appears well-developed HEENT:  Head: Normocephalic Eyes:EOM are normal Neck: Normal range of motion Cardiovascular: Normal rate Pulmonary/chest: Effort normal Neurologic: Patient is alert Skin: Skin is warm Psychiatric: Patient has normal mood and affect    Ortho Exam:  Ortho exam demonstrates full active and passive range of motion of that right leg.  He has positive nerve root tension signs on the right negative on the left.  No paresthesias L1 S1 bilaterally.  Motor strength is excellent both legs.  Specialty Comments:  No specialty comments available.  Imaging: No results found.   PMFS History: There are no active problems to display for this patient.  Past Medical History:  Diagnosis Date  . Asthma    as a child  . Macular degeneration   . Retinal tear of left eye   . Urinary urgency    Takes Ditropan    Family History  Problem Relation Age of Onset  . Crohn's disease Father     Past Surgical History:  Procedure Laterality Date  . EYE SURGERY Left   . GAS INSERTION Left 12/18/2014   Procedure: INSERTION OF GAS;  Surgeon: Jalene Mullet, MD;  Location: [place];  Service: Ophthalmology;  Laterality: Left;  SF6  . GAS/FLUID EXCHANGE Left 12/18/2014   Procedure: GAS/FLUID EXCHANGE;  Surgeon: Jalene Mullet, MD;  Location: [place];  Service: Ophthalmology;  Laterality: Left;  . PARS PLANA VITRECTOMY Left 12/18/2014   Procedure: VITRECTOMY 25 GAUGE WITH REMOVAL OF SCLERAL BUCKLE;  Surgeon: Jalene Mullet, MD;  Location: [place];  Service: Ophthalmology;  Laterality: Left;  . [place] INJECTION Left 12/18/2014   Procedure: PERFLUORONE INJECTION;  Surgeon: Dareen Piano  Allena KatzPatel, MD;  [facility];  Service: Ophthalmology;  Laterality: Left;   Social History   Occupational History  . Not on file  Tobacco Use  . Smoking status: Never Smoker  . Smokeless tobacco: Never Used  Substance and Sexual Activity  . Alcohol use: Yes    Alcohol/week: 6.0 standard drinks    Types: 6 Glasses of wine per week  . Drug use: No  . Sexual activity: Not on file

## 2019-03-30 NOTE — Telephone Encounter (Signed)
Per [employer] online portal no pa is needed for [id number].  

## 2019-04-02 ENCOUNTER — Ambulatory Visit: Payer: Self-pay

## 2019-04-02 ENCOUNTER — Ambulatory Visit (INDEPENDENT_AMBULATORY_CARE_PROVIDER_SITE_OTHER): Payer: BC Managed Care – PPO | Admitting: Physical Medicine and Rehabilitation

## 2019-04-02 ENCOUNTER — Encounter: Payer: Self-pay | Admitting: Physical Medicine and Rehabilitation

## 2019-04-02 VITALS — BP 113/66 | HR 66 | Temp 98.6°F

## 2019-04-02 DIAGNOSIS — M5116 Intervertebral disc disorders with radiculopathy, lumbar region: Secondary | ICD-10-CM | POA: Diagnosis not present

## 2019-04-02 DIAGNOSIS — M5416 Radiculopathy, lumbar region: Secondary | ICD-10-CM

## 2019-04-02 MED ORDER — BETAMETHASONE SOD PHOS & ACET 6 (3-3) MG/ML IJ SUSP
12.0000 mg | Freq: Once | INTRAMUSCULAR | Status: AC
  Administered 2019-04-02: 12 mg

## 2019-04-02 NOTE — Progress Notes (Signed)
 .  Numeric Pain Rating Scale and Functional Assessment Average Pain 7   In the last MONTH (on 0-10 scale) has pain interfered with the following?  1. General activity like being  able to carry out your everyday physical activities such as walking, climbing stairs, carrying groceries, or moving a chair?  Rating(3)   +Driver, -BT, -Dye Allergies.  

## 2019-04-03 NOTE — Procedures (Signed)
S1 Lumbosacral Transforaminal Epidural Steroid Injection - Sub-Pedicular Approach with Fluoroscopic Guidance   Patient: Christopher Frederick      Date of Birth: Sep 21, 1970 MRN: [id number] PCP: Denita Lung, MD      Visit Date: 04/02/2019   Universal Protocol:    Date/Time: 07/28/205:52 AM  Consent Given By: the patient  Position:  PRONE  Additional Comments: Vital signs were monitored before and after the procedure. Patient was prepped and draped in the usual sterile fashion. The correct patient, procedure, and site was verified.   Injection Procedure Details:  Procedure Site One Meds Administered:  Meds ordered this encounter  Medications  . betamethasone acetate-betamethasone sodium phosphate (CELESTONE) injection 12 mg    Laterality: Right  Location/Site:  S1 Foramen   Needle size: 22 ga.  Needle type: Spinal  Needle Placement: Transforaminal  Findings:   -Comments: Excellent flow of contrast along the nerve and into the epidural space.  Procedure Details: After squaring off the sacral end-plate to get a true AP view, the C-arm was positioned so that the best possible view of the S1 foramen was visualized. The soft tissues overlying this structure were infiltrated with 2-3 ml. of 1% Lidocaine without Epinephrine.    The spinal needle was inserted toward the target using a "trajectory" view along the fluoroscope beam.  Under AP and lateral visualization, the needle was advanced so it did not puncture dura. Biplanar projections were used to confirm position. Aspiration was confirmed to be negative for CSF and/or blood. A 1-2 ml. volume of Isovue-250 was injected and flow of contrast was noted at each level. Radiographs were obtained for documentation purposes.   After attaining the desired flow of contrast documented above, a 0.5 to 1.0 ml test dose of 0.25% Marcaine was injected into each respective transforaminal space.  The patient was observed for 90 seconds post  injection.  After no sensory deficits were reported, and normal lower extremity motor function was noted,   the above injectate was administered so that equal amounts of the injectate were placed at each foramen (level) into the transforaminal epidural space.   Additional Comments:  The patient tolerated the procedure well Dressing: Band-Aid with 2 x 2 sterile gauze    Post-procedure details: Patient was observed during the procedure. Post-procedure instructions were reviewed.  Patient left the clinic in stable condition.

## 2019-04-03 NOTE — Progress Notes (Signed)
Christopher Frederick - 48 y.o. male MRN [id number]  Date of birth: 1970-09-23  Office Visit Note: Visit Date: 04/02/2019 PCP: [name], [name], MD Referred by: [name], [name], MD  Subjective: Chief Complaint  Patient presents with  . Lower Back - Pain   HPI:  Christopher Frederick is a 48 y.o. male who comes in today At the request of [name] for interventional spine procedure.  Patient has had a long off-and-on course of low back pain particular on the right now with more recent severe worsening right hip and leg pain consistent with an S1 radicular pain.  Initially had severe symptoms with paresthesias and has been working with [employer] trainers as he is a [employer] [place] I believe.  He has gotten a little bit better but first thing in the morning is very severe worse with sitting but also prolonged walking.  MRI evidence of disc herniation and extrusion at L5-S1 on the right.  I do feel it would be important at this point as he is failed conservative care to complete a right S1 transforaminal epidural steroid injection.  He can continue take the ibuprofen and Tylenol as needed.  We will get him some idea about looking at exercises including neural flossing as well as McKenzie exercises which she is probably already doing through the trainers.  ROS Otherwise per HPI.  Assessment & Plan: Visit Diagnoses:  1. Lumbar radiculopathy   2. Radiculopathy due to lumbar intervertebral disc disorder     Plan: No additional findings.   Meds & Orders:  Meds ordered this encounter  Medications  . betamethasone acetate-betamethasone sodium phosphate (CELESTONE) injection 12 mg    Orders Placed This Encounter  Procedures  . XR C-ARM NO REPORT  . Epidural Steroid injection    Follow-up: Return if symptoms worsen or fail to improve.   Procedures: No procedures performed  S1 Lumbosacral Transforaminal Epidural Steroid Injection - Sub-Pedicular Approach with Fluoroscopic Guidance   Patient: Christopher Frederick  Christopher Frederick      Date of Birth: 1970-09-23 MRN: [id number] PCP: [name], [name], MD      Visit Date: 04/02/2019   Universal Protocol:    Date/Time: 07/28/205:52 AM  Consent Given By: the patient  Position:  PRONE  Additional Comments: Vital signs were monitored before and after the procedure. Patient was prepped and draped in the usual sterile fashion. The correct patient, procedure, and site was verified.   Injection Procedure Details:  Procedure Site One Meds Administered:  Meds ordered this encounter  Medications  . betamethasone acetate-betamethasone sodium phosphate (CELESTONE) injection 12 mg    Laterality: Right  Location/Site:  S1 Foramen   Needle size: 22 ga.  Needle type: Spinal  Needle Placement: Transforaminal  Findings:   -Comments: Excellent flow of contrast along the nerve and into the epidural space.  Procedure Details: After squaring off the sacral end-plate to get a true AP view, the C-arm was positioned so that the best possible view of the S1 foramen was visualized. The soft tissues overlying this structure were infiltrated with 2-3 ml. of 1% Lidocaine without Epinephrine.    The spinal needle was inserted toward the target using a "trajectory" view along the fluoroscope beam.  Under AP and lateral visualization, the needle was advanced so it did not puncture dura. Biplanar projections were used to confirm position. Aspiration was confirmed to be negative for CSF and/or blood. A 1-2 ml. volume of Isovue-250 was injected and flow of contrast was noted at each  level. Radiographs were obtained for documentation purposes.   After attaining the desired flow of contrast documented above, a 0.5 to 1.0 ml test dose of 0.25% Marcaine was injected into each respective transforaminal space.  The patient was observed for 90 seconds post injection.  After no sensory deficits were reported, and normal lower extremity motor function was noted,   the above injectate was  administered so that equal amounts of the injectate were placed at each foramen (level) into the transforaminal epidural space.   Additional Comments:  The patient tolerated the procedure well Dressing: Band-Aid with 2 x 2 sterile gauze    Post-procedure details: Patient was observed during the procedure. Post-procedure instructions were reviewed.  Patient left the clinic in stable condition.    Clinical History: Acute Interface, Incoming Rad Results - 03/28/2019  3:52 PM EDT INDICATION: Low back pain.  Low back pain in the morning. Pain in right side. Right hip pain. Numbness in right hip. Progressive symptoms for years.  COMPARISON: None.    TECHNIQUE:  Multiplanar, multisequence MR imaging of the lumbar spine (MRI SPINE LUMBAR WO IV CONTRAST) was performed (contrast: none.).  FINDINGS:  Bones: #  No acute fracture. #  No suspicious osseous lesions.  #  Type I Modic endplate changes involving the superior endplate of L4. #  Type I Modic changes on the right at L5-S1.  ALIGNMENT: #  Dextroconvex curvature centered at L3.  DISC LEVELS:  T12-L1: Mild degenerative disc disease. No significant central or foraminal stenosis.  L1-L2: Mild degenerative disc disease. Mild disc bulge. No significant central or foraminal stenosis.   L2-L3: No significant central or foraminal stenosis.    L3-L4: Mild disc bulge extending into bilateral foramen. No significant central or foraminal stenosis.   L4-L5: Mild facet degenerative changes. Mild disc bulge and endplate spurring extending into the foramen. No significant central or foraminal stenosis.    L5-S1: Mild facet degenerative changes. Mild degenerative disc disease. Disc bulge and endplate spurring. Right posterolateral disc protrusion. Moderate right foraminal stenosis. Disc material extends along the descending right S1 nerve root.  SPINAL CORD: No abnormal signal is demonstrated within the visualized distal spinal cord. Cauda  equina appears unremarkable. Conus medullaris terminates at L1.   PARASPINAL TISSUES: Within normal limits.  VISUALIZED SACRUM AND LOWER THORACIC SPINE:  No significant abnormality.   IMPRESSION: Degenerative changes and a right posterolateral disc protrusion at L5-S1 contribute to moderate right foraminal stenosis and possible impingement of the descending right S1 nerve root.     Objective:  VS:  HT:    WT:   BMI:     BP:113/66  HR:66bpm  TEMP:98.6 F (37 C)(Oral)  RESP:  Physical Exam  Ortho Exam Imaging: Xr C-arm No Report  Result Date: 04/02/2019 Please see Notes tab for imaging impression.

## 2019-04-17 ENCOUNTER — Other Ambulatory Visit: Payer: BC Managed Care – PPO

## 2019-04-23 ENCOUNTER — Ambulatory Visit: Payer: BC Managed Care – PPO | Admitting: Orthopedic Surgery

## 2019-10-18 ENCOUNTER — Ambulatory Visit: Payer: BC Managed Care – PPO

## 2019-11-09 ENCOUNTER — Ambulatory Visit: Payer: BC Managed Care – PPO | Attending: Internal Medicine

## 2019-11-09 DIAGNOSIS — Z23 Encounter for immunization: Secondary | ICD-10-CM

## 2019-11-09 NOTE — Progress Notes (Signed)
   Covid-19 Vaccination Clinic  Name:  Christopher Frederick    MRN: [id number] DOB: [birth year]  11/09/2019  [name] was observed post Covid-19 immunization for 15 minutes without incident. He was provided with Vaccine Information Sheet and instruction to access the V-Safe system.   [name] was instructed to call 911 with any severe reactions post vaccine: Marland Kitchen Difficulty breathing  . Swelling of face and throat  . A fast heartbeat  . A bad rash all over body  . Dizziness and weakness   Immunizations Administered    Name Date Dose VIS Date Route   Pfizer COVID-19 Vaccine 11/09/2019  6:44 PM 0.3 mL 08/17/2019 Intramuscular   Manufacturer: [employer], [employer]   Lot: [id number]   NDC: [address]-1

## 2019-12-11 ENCOUNTER — Ambulatory Visit: Payer: BC Managed Care – PPO | Attending: Internal Medicine

## 2019-12-11 DIAGNOSIS — Z23 Encounter for immunization: Secondary | ICD-10-CM

## 2019-12-11 NOTE — Progress Notes (Signed)
   Covid-19 Vaccination Clinic  Name:  Christopher Frederick    MRN: [id number] DOB: 1971-02-06  12/11/2019  [name] was observed post Covid-19 immunization for 15 minutes without incident. He was provided with Vaccine Information Sheet and instruction to access the V-Safe system.   [name] was instructed to call 911 with any severe reactions post vaccine: Marland Kitchen Difficulty breathing  . Swelling of face and throat  . A fast heartbeat  . A bad rash all over body  . Dizziness and weakness   Immunizations Administered    Name Date Dose VIS Date Route   Pfizer COVID-19 Vaccine 12/11/2019  8:25 AM 0.3 mL 08/17/2019 Intramuscular   Manufacturer: [employer], [employer]   Lot: [id number]   NDC: [address]-1

## 2020-01-05 HISTORY — PX: EYE SURGERY: SHX253

## 2020-05-23 ENCOUNTER — Ambulatory Visit: Payer: Self-pay

## 2020-05-23 ENCOUNTER — Ambulatory Visit: Payer: BC Managed Care – PPO | Admitting: Orthopedic Surgery

## 2020-05-23 DIAGNOSIS — M545 Low back pain, unspecified: Secondary | ICD-10-CM

## 2020-05-23 DIAGNOSIS — M25561 Pain in right knee: Secondary | ICD-10-CM | POA: Diagnosis not present

## 2020-05-24 ENCOUNTER — Encounter: Payer: Self-pay | Admitting: Orthopedic Surgery

## 2020-05-24 NOTE — Progress Notes (Signed)
Office Visit Note   Patient: Christopher Frederick           Date of Birth: [birth year]           MRN: [id number] Visit Date: 05/23/2020 Requested by: Ronnald Nian, MD [address] [place],  [place] [address] PCP: Ronnald Nian, MD  Subjective: Chief Complaint  Patient presents with  . Right Knee - Pain    HPI: Christopher Frederick is a 49 year old patient with right knee pain.  Approximately 6 weeks ago he sustained an injury to his right knee when he planted his foot and twisted one night.  Felt a pop at that time.  Had pain for several days but then it improved.  Did some self-directed rehabilitation type exercises on his own and was doing reasonably well until 3 weeks ago when he was standing on the putting green catching his team at [facility] when the knee collapsed.  He was seen by a trainer at that time who recommended further investigation.  He does report pain which moves around in the knee.  This includes medially laterally as well as anteriorly and superiorly in the right knee.  Initially he did have trouble with full flexion with mild effusion by history at that time.  He has been taking some over-the-counter medication with some relief.  Still reports sporadic mechanical symptoms in the knee causing these symptoms.  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  Definitely not a sitdown type job.              ROS: All systems reviewed are negative as they relate to the chief complaint within the history of present illness.  Patient denies  fevers or chills.   Assessment & Plan: Visit Diagnoses:  1. Right knee pain, unspecified chronicity   2. Low back pain, unspecified back pain laterality, unspecified chronicity, unspecified whether sciatica present     Plan: Impression is right knee pain with possible meniscal pathology based on history of injury as well as subsequent history of giving way since that time.  Radiographs show slight valgus alignment but no  arthritis.  Based on his history and failure of conservative management I think MRI scanning is indicated to rule out unstable meniscal pathology on the lateral and/or medial side.  Follow-up after that scan.  Follow-Up Instructions: Return for after MRI.   Orders:  Orders Placed This Encounter  Procedures  . XR KNEE 3 VIEW RIGHT  . MR Knee Right w/o contrast  . Ambulatory referral to Physical Medicine Rehab   No orders of the defined types were placed in this encounter.     Procedures: No procedures performed   Clinical Data: No additional findings.  Objective: Vital Signs: There were no vitals taken for this visit.  Physical Exam:   Constitutional: Patient appears well-developed HEENT:  Head: Normocephalic Eyes:EOM are normal Neck: Normal range of motion Cardiovascular: Normal rate Pulmonary/chest: Effort normal Neurologic: Patient is alert Skin: Skin is warm Psychiatric: Patient has normal mood and affect    Ortho Exam: Ortho exam demonstrates full active and passive range of motion of the right knee.  Has both medial and lateral joint line tenderness.  Stable collateral and cruciate ligaments.  McMurray compression testing equivocal for medial or lateral compartment pathology.  Extensor mechanism nontender.  No groin pain with internal extra rotation of the leg.  Specialty Comments:  No specialty comments available.  Imaging: XR KNEE 3 VIEW RIGHT  Result  Date: 05/23/2020 AP lateral merchant right knee reviewed.  No acute fracture.  Alignment intact.  No arthritis.  Small bony ossicle noted at the superior aspect of the tibial tubercle.  Normal right knee    PMFS History: There are no problems to display for this patient.  Past Medical History:  Diagnosis Date  . Asthma    as a child  . Macular degeneration   . Retinal tear of left eye   . Urinary urgency    Takes Ditropan    Family History  Problem Relation Age of Onset  . Crohn's disease Father       Past Surgical History:  Procedure Laterality Date  . EYE SURGERY Left   . GAS INSERTION Left 12/18/2014   Procedure: INSERTION OF GAS;  Surgeon: Carmela Rima, MD;  [facility];  Service: Ophthalmology;  Laterality: Left;  SF6  . GAS/FLUID EXCHANGE Left 12/18/2014   Procedure: GAS/FLUID EXCHANGE;  Surgeon: Carmela Rima, MD;  Location: [facility];  Service: Ophthalmology;  Laterality: Left;  . PARS PLANA VITRECTOMY Left 12/18/2014   Procedure: VITRECTOMY 25 GAUGE WITH REMOVAL OF SCLERAL BUCKLE;  Surgeon: Carmela Rima, MD;  Location: [facility] OR;  Service: Ophthalmology;  Laterality: Left;  . PERFLUORONE INJECTION Left 12/18/2014   Procedure: PERFLUORONE INJECTION;  Surgeon: Carmela Rima, MD;  Location: [facility] OR;  Service: Ophthalmology;  Laterality: Left;   Social History   Occupational History  . Not on file  Tobacco Use  . Smoking status: Never Smoker  . Smokeless tobacco: Never Used  Substance and Sexual Activity  . Alcohol use: Yes    Alcohol/week: 6.0 standard drinks    Types: 6 Glasses of wine per week  . Drug use: No  . Sexual activity: Not on file

## 2020-06-11 ENCOUNTER — Telehealth: Payer: Self-pay | Admitting: Physical Medicine and Rehabilitation

## 2020-06-11 NOTE — Telephone Encounter (Signed)
Called pt back and sch 10/25.

## 2020-06-11 NOTE — Telephone Encounter (Signed)
Pt would like to get scheduled for an injection for his lower back   [phone]

## 2020-06-24 ENCOUNTER — Ambulatory Visit
Admission: RE | Admit: 2020-06-24 | Discharge: 2020-06-24 | Disposition: A | Discharge status: Home / Self Care | Payer: BC Managed Care – PPO | Source: Ambulatory Visit | Attending: Orthopedic Surgery | Admitting: Orthopedic Surgery

## 2020-06-24 DIAGNOSIS — M25561 Pain in right knee: Secondary | ICD-10-CM

## 2020-06-30 ENCOUNTER — Other Ambulatory Visit: Payer: Self-pay

## 2020-06-30 ENCOUNTER — Ambulatory Visit: Payer: Self-pay

## 2020-06-30 ENCOUNTER — Encounter: Payer: Self-pay | Admitting: Physical Medicine and Rehabilitation

## 2020-06-30 ENCOUNTER — Ambulatory Visit (INDEPENDENT_AMBULATORY_CARE_PROVIDER_SITE_OTHER): Payer: BC Managed Care – PPO | Admitting: Physical Medicine and Rehabilitation

## 2020-06-30 VITALS — BP 108/75 | HR 76

## 2020-06-30 DIAGNOSIS — M5416 Radiculopathy, lumbar region: Secondary | ICD-10-CM | POA: Diagnosis not present

## 2020-06-30 MED ORDER — BETAMETHASONE SOD PHOS & ACET 6 (3-3) MG/ML IJ SUSP
12.0000 mg | Freq: Once | INTRAMUSCULAR | Status: AC
  Administered 2020-06-30: 12 mg

## 2020-06-30 NOTE — Progress Notes (Signed)
Hi Lauren I talked to Niarada.  He wants to get his right knee scoped with meniscal repair done.  He would like to get that done the week of the 12th so anytime after December 12 is ideal thank you

## 2020-06-30 NOTE — Progress Notes (Signed)
Pt state lower back pain that travels down his right leg. Pt state walking and standing for a long period of time makes the pain worse. Pt state he takes pain meds and use heating pad to ease his pain.  Numeric Pain Rating Scale and Functional Assessment Average Pain 6   In the last MONTH (on 0-10 scale) has pain interfered with the following?  1. General activity like being  able to carry out your everyday physical activities such as walking, climbing stairs, carrying groceries, or moving a chair?  Rating(10)   +Driver, -BT, -Dye Allergies.

## 2020-07-08 ENCOUNTER — Encounter: Payer: Self-pay | Admitting: Physical Medicine and Rehabilitation

## 2020-07-09 NOTE — Progress Notes (Signed)
Christopher Frederick - 49 y.o. male MRN [id number]  Date of birth: 20-Dec-1970  Office Visit Note: Visit Date: 06/30/2020 PCP: Ronnald Nian, MD Referred by: Ronnald Nian, MD  Subjective: Chief Complaint  Patient presents with  . Lower Back - Pain  . Right Leg - Pain   HPI:  Christopher Frederick is a 49 y.o. male who comes in today for planned repeat Right S1-2 Lumbar epidural steroid injection with fluoroscopic guidance.  The patient has failed conservative care including home exercise, medications, time and activity modification.  This injection will be diagnostic and hopefully therapeutic.  Please see requesting physician notes for further details and justification. Patient received more than 50% pain relief from prior injection.   Referring: Dr. Burnard Bunting  MRI reviewed with images and spine model.  MRI reviewed in the note below.  He reports injection in July of last year did well for the short-term and he did feel like it gave him some relief.  I did review the images today and he did not like he had adequate placement of the injection.  He does not report really any longer term relief than just short-term but it did help quite a bit at the time.  He has just gone on to continue with exercises and stretching and medications.  He comes in today with pain 6 out of 10 with referral down the back of the right leg in a classic S1 distribution just as noted on the prior MRI.  Has disc herniation at L5-S1.  We will repeat the injection today.  Talked about natural history of disc herniations and treatment including injections and surgical treatment as well as medications and therapy.    ROS Otherwise per HPI.  Assessment & Plan: Visit Diagnoses:  1. Lumbar radiculopathy     Plan: No additional findings.   Meds & Orders:  Meds ordered this encounter  Medications  . betamethasone acetate-betamethasone sodium phosphate (CELESTONE) injection 12 mg    Orders Placed This Encounter    Procedures  . XR C-ARM NO REPORT  . Epidural Steroid injection    Follow-up: Return if symptoms worsen or fail to improve.   Procedures: No procedures performed  S1 Lumbosacral Transforaminal Epidural Steroid Injection - Sub-Pedicular Approach with Fluoroscopic Guidance   Patient: Christopher Frederick      Date of Birth: Jun 02, 1971 MRN: [id number] PCP: Ronnald Nian, MD      Visit Date: 06/30/2020   Universal Protocol:    Date/Time: 11/03/218:20 AM  Consent Given By: the patient  Position:  PRONE  Additional Comments: Vital signs were monitored before and after the procedure. Patient was prepped and draped in the usual sterile fashion. The correct patient, procedure, and site was verified.   Injection Procedure Details:  Procedure Site One Meds Administered:  Meds ordered this encounter  Medications  . betamethasone acetate-betamethasone sodium phosphate (CELESTONE) injection 12 mg    Laterality: Right  Location/Site:  S1 Foramen   Needle size: 22 ga.  Needle type: Spinal  Needle Placement: Transforaminal  Findings:   -Comments: Excellent flow of contrast along the nerve and into the epidural space.  Epidurogram: Contrast epidurogram showed no nerve root cut off or restricted flow pattern.  Procedure Details: After squaring off the sacral end-plate to get a true AP view, the C-arm was positioned so that the best possible view of the S1 foramen was visualized. The soft tissues overlying this structure were infiltrated with 2-3 ml. of 1% Lidocaine without  Epinephrine.    The spinal needle was inserted toward the target using a "trajectory" view along the fluoroscope beam.  Under AP and lateral visualization, the needle was advanced so it did not puncture dura. Biplanar projections were used to confirm position. Aspiration was confirmed to be negative for CSF and/or blood. A 1-2 ml. volume of Isovue-250 was injected and flow of contrast was noted at each level.  Radiographs were obtained for documentation purposes.   After attaining the desired flow of contrast documented above, a 0.5 to 1.0 ml test dose of 0.25% Marcaine was injected into each respective transforaminal space.  The patient was observed for 90 seconds post injection.  After no sensory deficits were reported, and normal lower extremity motor function was noted,   the above injectate was administered so that equal amounts of the injectate were placed at each foramen (level) into the transforaminal epidural space.   Additional Comments:  The patient tolerated the procedure well Dressing: Band-Aid with 2 x 2 sterile gauze    Post-procedure details: Patient was observed during the procedure. Post-procedure instructions were reviewed.  Patient left the clinic in stable condition.     Clinical History: Acute Interface, Incoming Rad Results - 03/28/2019  3:52 PM EDT INDICATION: Low back pain.  Low back pain in the morning. Pain in right side. Right hip pain. Numbness in right hip. Progressive symptoms for years.  COMPARISON: None.    TECHNIQUE:  Multiplanar, multisequence MR imaging of the lumbar spine (MRI SPINE LUMBAR WO IV CONTRAST) was performed (contrast: none.).  FINDINGS:  Bones: #  No acute fracture. #  No suspicious osseous lesions.  #  Type I Modic endplate changes involving the superior endplate of L4. #  Type I Modic changes on the right at L5-S1.  ALIGNMENT: #  Dextroconvex curvature centered at L3.  DISC LEVELS:  T12-L1: Mild degenerative disc disease. No significant central or foraminal stenosis.  L1-L2: Mild degenerative disc disease. Mild disc bulge. No significant central or foraminal stenosis.   L2-L3: No significant central or foraminal stenosis.    L3-L4: Mild disc bulge extending into bilateral foramen. No significant central or foraminal stenosis.   L4-L5: Mild facet degenerative changes. Mild disc bulge and endplate spurring extending into the  foramen. No significant central or foraminal stenosis.    L5-S1: Mild facet degenerative changes. Mild degenerative disc disease. Disc bulge and endplate spurring. Right posterolateral disc protrusion. Moderate right foraminal stenosis. Disc material extends along the descending right S1 nerve root.  SPINAL CORD: No abnormal signal is demonstrated within the visualized distal spinal cord. Cauda equina appears unremarkable. Conus medullaris terminates at L1.   PARASPINAL TISSUES: Within normal limits.  VISUALIZED SACRUM AND LOWER THORACIC SPINE:  No significant abnormality.   IMPRESSION: Degenerative changes and a right posterolateral disc protrusion at L5-S1 contribute to moderate right foraminal stenosis and possible impingement of the descending right S1 nerve root.     Objective:  VS:  HT:    WT:   BMI:     BP:108/75  HR:76bpm  TEMP: ( )  RESP:  Physical Exam Constitutional:      General: He is not in acute distress.    Appearance: Normal appearance. He is not ill-appearing.  HENT:     Head: Normocephalic and atraumatic.     Right Ear: External ear normal.     Left Ear: External ear normal.  Eyes:     Extraocular Movements: Extraocular movements intact.  Cardiovascular:  Rate and Rhythm: Normal rate.     Pulses: Normal pulses.  Abdominal:     General: There is no distension.     Palpations: Abdomen is soft.  Musculoskeletal:        General: No tenderness or signs of injury.     Right lower leg: No edema.     Left lower leg: No edema.     Comments: Patient has good distal strength without clonus.  Positive slump on the right.  Skin:    Findings: No erythema or rash.  Neurological:     General: No focal deficit present.     Mental Status: He is alert and oriented to person, place, and time.     Sensory: No sensory deficit.     Motor: No weakness or abnormal muscle tone.     Coordination: Coordination normal.  Psychiatric:        Mood and Affect: Mood  normal.        Behavior: Behavior normal.      Imaging: No results found.

## 2020-07-09 NOTE — Procedures (Signed)
S1 Lumbosacral Transforaminal Epidural Steroid Injection - Sub-Pedicular Approach with Fluoroscopic Guidance   Patient: Christopher Frederick      Date of Birth: 23-Aug-1971 MRN: [id number] PCP: Ronnald Nian, MD      Visit Date: 06/30/2020   Universal Protocol:    Date/Time: 11/03/218:20 AM  Consent Given By: the patient  Position:  PRONE  Additional Comments: Vital signs were monitored before and after the procedure. Patient was prepped and draped in the usual sterile fashion. The correct patient, procedure, and site was verified.   Injection Procedure Details:  Procedure Site One Meds Administered:  Meds ordered this encounter  Medications  . betamethasone acetate-betamethasone sodium phosphate (CELESTONE) injection 12 mg    Laterality: Right  Location/Site:  S1 Foramen   Needle size: 22 ga.  Needle type: Spinal  Needle Placement: Transforaminal  Findings:   -Comments: Excellent flow of contrast along the nerve and into the epidural space.  Epidurogram: Contrast epidurogram showed no nerve root cut off or restricted flow pattern.  Procedure Details: After squaring off the sacral end-plate to get a true AP view, the C-arm was positioned so that the best possible view of the S1 foramen was visualized. The soft tissues overlying this structure were infiltrated with 2-3 ml. of 1% Lidocaine without Epinephrine.    The spinal needle was inserted toward the target using a "trajectory" view along the fluoroscope beam.  Under AP and lateral visualization, the needle was advanced so it did not puncture dura. Biplanar projections were used to confirm position. Aspiration was confirmed to be negative for CSF and/or blood. A 1-2 ml. volume of Isovue-250 was injected and flow of contrast was noted at each level. Radiographs were obtained for documentation purposes.   After attaining the desired flow of contrast documented above, a 0.5 to 1.0 ml test dose of 0.25% Marcaine was  injected into each respective transforaminal space.  The patient was observed for 90 seconds post injection.  After no sensory deficits were reported, and normal lower extremity motor function was noted,   the above injectate was administered so that equal amounts of the injectate were placed at each foramen (level) into the transforaminal epidural space.   Additional Comments:  The patient tolerated the procedure well Dressing: Band-Aid with 2 x 2 sterile gauze    Post-procedure details: Patient was observed during the procedure. Post-procedure instructions were reviewed.  Patient left the clinic in stable condition.

## 2020-08-06 HISTORY — PX: KNEE SURGERY: SHX244

## 2020-08-18 ENCOUNTER — Encounter: Payer: Self-pay | Admitting: Orthopedic Surgery

## 2020-08-18 ENCOUNTER — Other Ambulatory Visit: Payer: Self-pay | Admitting: Surgical

## 2020-08-18 DIAGNOSIS — M2341 Loose body in knee, right knee: Secondary | ICD-10-CM | POA: Diagnosis not present

## 2020-08-18 DIAGNOSIS — S83271D Complex tear of lateral meniscus, current injury, right knee, subsequent encounter: Secondary | ICD-10-CM | POA: Diagnosis not present

## 2020-08-18 MED ORDER — TIZANIDINE HCL 4 MG PO TABS: 4.0000 mg | ORAL_TABLET | Freq: Three times a day (TID) | ORAL | 0 refills | Status: DC

## 2020-08-18 MED ORDER — OXYCODONE-ACETAMINOPHEN 5-325 MG PO TABS
1.0000 | ORAL_TABLET | ORAL | 0 refills | Status: AC | PRN
Start: 2020-08-18 — End: 2021-08-18

## 2020-08-19 ENCOUNTER — Telehealth: Payer: Self-pay | Admitting: Orthopedic Surgery

## 2020-08-19 NOTE — Telephone Encounter (Signed)
IC patient. He stated that he already had his questions answered regarding his post op care. He will call us with any further questions/concerns.

## 2020-08-19 NOTE — Telephone Encounter (Signed)
Pt called stating he had some question about his post surgery instructions and would like a CB to discuss further  [phone]

## 2020-08-25 ENCOUNTER — Ambulatory Visit (INDEPENDENT_AMBULATORY_CARE_PROVIDER_SITE_OTHER): Payer: BC Managed Care – PPO | Admitting: Orthopedic Surgery

## 2020-08-25 ENCOUNTER — Other Ambulatory Visit: Payer: Self-pay

## 2020-08-25 DIAGNOSIS — M25561 Pain in right knee: Secondary | ICD-10-CM

## 2020-08-31 ENCOUNTER — Encounter: Payer: Self-pay | Admitting: Orthopedic Surgery

## 2020-08-31 MED ORDER — DICLOFENAC SODIUM 75 MG PO TBEC: 75.0000 mg | DELAYED_RELEASE_TABLET | Freq: Two times a day (BID) | ORAL | 0 refills | Status: DC

## 2020-08-31 NOTE — Progress Notes (Signed)
Post-Op Visit Note   Patient: Christopher Frederick           Date of Birth: [birth year]           MRN: [id number] Visit Date: 08/25/2020 PCP: Ronnald Nian, MD   Assessment & Plan:  Chief Complaint:  Chief Complaint  Patient presents with  . Right Knee - Routine Post Op   Visit Diagnoses:  1. 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  Doing well.  Taking Tylenol and ibuprofen only.  He is having some left anterior tibial pain.  Recently went on a golf trip before his surgery.  He was walking around a lot on sand and this aggravated his distal anterior compartment tendons around the ankle.  On examination he has minimal effusion of the right knee.  Portal incisions are intact and sutures are removed.  Range of motion is excellent.  Continue with nonweightbearing quad strengthening exercises including stationary biking for the next 4 weeks and then he can transition into more ambulatory types of rehabilitation.  Regarding playing golf I think starting out with chipping and putting for a week or 2 is advisable before playing all out.  The left ankle is also examined.  He does have a little bit of crepitus which is more consistent with tenosynovitis as opposed to any tearing.  It is localized right to that area where the anterior tib tendon goes under the retinaculum in the anterior aspect of the ankle.  Does have little swelling in this area but no calf tenderness negative Homans.  I think this is something that should improve with conservative treatment.  Gave him some topical Voltaren to try as well as prescription for a compression sock to help with swelling.  We will also try him on a Voltaren taper orally once he finishes with the topical.  I do want him to hold off on ibuprofen while he is taking this Voltaren.  Follow-Up Instructions: Return if symptoms worsen or fail to improve.    Orders:  No orders of the defined types were placed in this encounter.  No orders of the defined types were placed in this encounter.   Imaging: No results found.  PMFS History: There are no problems to display for this patient.  Past Medical History:  Diagnosis Date  . Asthma    as a child  . Macular degeneration   . Retinal tear of left eye   . Urinary urgency    Takes Ditropan    Family History  Problem Relation Age of Onset  . Crohn's disease Father     Past Surgical History:  Procedure Laterality Date  . EYE SURGERY Left   . GAS INSERTION Left 12/18/2014   Procedure: INSERTION OF GAS;  Surgeon: Carmela Rima, MD;  Location: [facility] OR;  Service: Ophthalmology;  Laterality: Left;  SF6  . GAS/FLUID EXCHANGE Left 12/18/2014   Procedure: GAS/FLUID EXCHANGE;  Surgeon: Carmela Rima, MD;  Location: [facility] OR;  Service: Ophthalmology;  Laterality: Left;  . PARS PLANA VITRECTOMY Left 12/18/2014   Procedure: VITRECTOMY 25 GAUGE WITH REMOVAL OF SCLERAL BUCKLE;  Surgeon: Carmela Rima, MD;  Location: [facility] OR;  Service: Ophthalmology;  Laterality: Left;  . PERFLUORONE INJECTION Left 12/18/2014   Procedure: PERFLUORONE INJECTION;  Surgeon: Carmela Rima, MD;  Location: [facility] OR;  Service: Ophthalmology;  Laterality: Left;   Social History   Occupational History  .  Not on file  Tobacco Use  . Smoking status: Never Smoker  . Smokeless tobacco: Never Used  Substance and Sexual Activity  . Alcohol use: Yes    Alcohol/week: 6.0 standard drinks    Types: 6 Glasses of wine per week  . Drug use: No  . Sexual activity: Not on file

## 2020-09-09 ENCOUNTER — Other Ambulatory Visit: Payer: Self-pay | Admitting: Surgical

## 2020-09-09 NOTE — Telephone Encounter (Signed)
Please advise 

## 2020-09-23 ENCOUNTER — Other Ambulatory Visit: Payer: Self-pay | Admitting: Surgical

## 2020-09-24 NOTE — Telephone Encounter (Signed)
Rx and rf request do  not make sense

## 2020-09-24 NOTE — Telephone Encounter (Signed)
This rx and request do not make sense

## 2020-11-15 ENCOUNTER — Ambulatory Visit: Payer: Self-pay | Admitting: Ophthalmology

## 2020-11-15 ENCOUNTER — Encounter (HOSPITAL_COMMUNITY): Payer: Self-pay | Admitting: Ophthalmology

## 2020-11-15 ENCOUNTER — Other Ambulatory Visit: Payer: Self-pay

## 2020-11-15 ENCOUNTER — Inpatient Hospital Stay (HOSPITAL_COMMUNITY): Payer: BC Managed Care – PPO | Admitting: Anesthesiology

## 2020-11-15 ENCOUNTER — Encounter (HOSPITAL_COMMUNITY)
Admission: RE | Disposition: A | Discharge status: Home / Self Care | Payer: Self-pay | Source: Home / Self Care | Attending: Ophthalmology

## 2020-11-15 ENCOUNTER — Ambulatory Visit (HOSPITAL_COMMUNITY)
Admission: RE | Admit: 2020-11-15 | Discharge: 2020-11-15 | Disposition: A | Discharge status: Home / Self Care | Payer: BC Managed Care – PPO | Attending: Ophthalmology | Admitting: Ophthalmology

## 2020-11-15 DIAGNOSIS — Z20822 Contact with and (suspected) exposure to covid-19: Secondary | ICD-10-CM | POA: Insufficient documentation

## 2020-11-15 DIAGNOSIS — H33021 Retinal detachment with multiple breaks, right eye: Secondary | ICD-10-CM | POA: Insufficient documentation

## 2020-11-15 HISTORY — PX: PARS PLANA VITRECTOMY: SHX2166

## 2020-11-15 SURGERY — PARS PLANA VITRECTOMY WITH 25 GAUGE
Anesthesia: Monitor Anesthesia Care | Laterality: Right

## 2020-11-15 MED ORDER — SODIUM CHLORIDE 0.9 % IV SOLN: INTRAVENOUS | Status: DC

## 2020-11-15 MED ORDER — ORAL CARE MOUTH RINSE: 15.0000 mL | Freq: Once | OROMUCOSAL | Status: AC

## 2020-11-15 MED ORDER — EPINEPHRINE PF 1 MG/ML IJ SOLN
INTRAOCULAR | Status: DC | PRN
  Administered 2020-11-15: .3 mL

## 2020-11-15 MED ORDER — PHENYLEPHRINE HCL 2.5 % OP SOLN
OPHTHALMIC | Status: AC
  Administered 2020-11-15: 1 [drp] via OPHTHALMIC
  Filled 2020-11-15: qty 2

## 2020-11-15 MED ORDER — ONDANSETRON HCL 4 MG/2ML IJ SOLN
INTRAMUSCULAR | Status: AC
  Filled 2020-11-15: qty 2

## 2020-11-15 MED ORDER — PROPOFOL 10 MG/ML IV BOLUS
INTRAVENOUS | Status: AC
  Filled 2020-11-15: qty 20

## 2020-11-15 MED ORDER — ONDANSETRON HCL 4 MG/2ML IJ SOLN
INTRAMUSCULAR | Status: DC | PRN
  Administered 2020-11-15: 4 mg via INTRAVENOUS

## 2020-11-15 MED ORDER — CYCLOPENTOLATE HCL 1 % OP SOLN
OPHTHALMIC | Status: AC
  Administered 2020-11-15: 1 [drp] via OPHTHALMIC
  Filled 2020-11-15: qty 2

## 2020-11-15 MED ORDER — LIDOCAINE HCL 2 % IJ SOLN
INTRAMUSCULAR | Status: DC | PRN
  Administered 2020-11-15: 6 mL via RETROBULBAR

## 2020-11-15 MED ORDER — MIDAZOLAM HCL 5 MG/5ML IJ SOLN
INTRAMUSCULAR | Status: DC | PRN
  Administered 2020-11-15 (×2): 1 mg via INTRAVENOUS

## 2020-11-15 MED ORDER — CEFAZOLIN SUBCONJUNCTIVAL INJECTION 100 MG/0.5 ML
INJECTION | SUBCONJUNCTIVAL | Status: DC | PRN
  Administered 2020-11-15: 40 mg via SUBCONJUNCTIVAL

## 2020-11-15 MED ORDER — OFLOXACIN 0.3 % OP SOLN
1.0000 [drp] | OPHTHALMIC | Status: AC | PRN
  Administered 2020-11-15 (×2): 1 [drp] via OPHTHALMIC

## 2020-11-15 MED ORDER — MIDAZOLAM HCL 2 MG/2ML IJ SOLN
INTRAMUSCULAR | Status: AC
  Filled 2020-11-15: qty 2

## 2020-11-15 MED ORDER — FENTANYL CITRATE (PF) 250 MCG/5ML IJ SOLN
INTRAMUSCULAR | Status: DC | PRN
  Administered 2020-11-15 (×2): 25 ug via INTRAVENOUS

## 2020-11-15 MED ORDER — DEXAMETHASONE SODIUM PHOSPHATE 10 MG/ML IJ SOLN
INTRAMUSCULAR | Status: DC | PRN
  Administered 2020-11-15: .9 mL

## 2020-11-15 MED ORDER — PROPOFOL 10 MG/ML IV BOLUS
INTRAVENOUS | Status: DC | PRN
  Administered 2020-11-15: 50 mg via INTRAVENOUS
  Administered 2020-11-15: 30 mg via INTRAVENOUS
  Administered 2020-11-15: 40 mg via INTRAVENOUS
  Administered 2020-11-15: 20 mg via INTRAVENOUS
  Administered 2020-11-15: 60 mg via INTRAVENOUS

## 2020-11-15 MED ORDER — TETRACAINE HCL 0.5 % OP SOLN
OPHTHALMIC | Status: DC | PRN
  Administered 2020-11-15: 1 [drp] via OPHTHALMIC

## 2020-11-15 MED ORDER — CEFAZOLIN SUBCONJUNCTIVAL INJECTION 100 MG/0.5 ML
100.0000 mg | INJECTION | SUBCONJUNCTIVAL | Status: DC
  Filled 2020-11-15: qty 5

## 2020-11-15 MED ORDER — OFLOXACIN 0.3 % OP SOLN
OPHTHALMIC | Status: AC
  Administered 2020-11-15: 1 [drp] via OPHTHALMIC
  Filled 2020-11-15: qty 5

## 2020-11-15 MED ORDER — CHLORHEXIDINE GLUCONATE 0.12 % MT SOLN
OROMUCOSAL | Status: AC
  Administered 2020-11-15: 15 mL via OROMUCOSAL
  Filled 2020-11-15: qty 15

## 2020-11-15 MED ORDER — HYPROMELLOSE (GONIOSCOPIC) 2.5 % OP SOLN
OPHTHALMIC | Status: DC | PRN
  Administered 2020-11-15: 2 [drp] via OPHTHALMIC

## 2020-11-15 MED ORDER — PHENYLEPHRINE HCL 2.5 % OP SOLN
1.0000 [drp] | OPHTHALMIC | Status: AC | PRN
  Administered 2020-11-15 (×2): 1 [drp] via OPHTHALMIC

## 2020-11-15 MED ORDER — FENTANYL CITRATE (PF) 250 MCG/5ML IJ SOLN
INTRAMUSCULAR | Status: AC
  Filled 2020-11-15: qty 5

## 2020-11-15 MED ORDER — TOBRAMYCIN-DEXAMETHASONE 0.3-0.1 % OP OINT
TOPICAL_OINTMENT | OPHTHALMIC | Status: DC | PRN
  Administered 2020-11-15: 1 via OPHTHALMIC

## 2020-11-15 MED ORDER — CHLORHEXIDINE GLUCONATE 0.12 % MT SOLN: 15.0000 mL | Freq: Once | OROMUCOSAL | Status: AC

## 2020-11-15 MED ORDER — CYCLOPENTOLATE HCL 1 % OP SOLN
1.0000 [drp] | OPHTHALMIC | Status: AC | PRN
  Administered 2020-11-15 (×2): 1 [drp] via OPHTHALMIC

## 2020-11-15 MED ORDER — BSS PLUS IO SOLN
INTRAOCULAR | Status: DC | PRN
  Administered 2020-11-15: 1 via INTRAOCULAR

## 2020-11-15 SURGICAL SUPPLY — 56 items
APPLICATOR COTTON TIP 6 STRL (MISCELLANEOUS) ×1 IMPLANT
APPLICATOR COTTON TIP 6IN STRL (MISCELLANEOUS) ×2
BAND WRIST GAS GREEN (MISCELLANEOUS) ×1 IMPLANT
BLADE MVR KNIFE 20G (BLADE) IMPLANT
BLADE STAB KNIFE 15DEG (BLADE) IMPLANT
CANNULA ANT CHAM MAIN (OPHTHALMIC RELATED) IMPLANT
CANNULA DUAL BORE 23G (CANNULA) IMPLANT
CANNULA DUALBORE 25G (CANNULA) IMPLANT
CANNULA VLV SOFT TIP 25GA (OPHTHALMIC) ×2 IMPLANT
CAUTERY EYE LOW TEMP 1300F FIN (OPHTHALMIC RELATED) IMPLANT
CLSR STERI-STRIP ANTIMIC 1/2X4 (GAUZE/BANDAGES/DRESSINGS) ×2 IMPLANT
COVER MAYO STAND STRL (DRAPES) ×2 IMPLANT
DRAPE HALF SHEET 40X57 (DRAPES) ×2 IMPLANT
DRAPE INCISE 51X51 W/FILM STRL (DRAPES) ×2 IMPLANT
DRAPE RETRACTOR (MISCELLANEOUS) ×2 IMPLANT
FORCEPS ECKARDT ILM 25G SERR (OPHTHALMIC RELATED) IMPLANT
FORCEPS GRIESHABER ILM 25G A (INSTRUMENTS) IMPLANT
GAS AUTO FILL CONSTEL (OPHTHALMIC) ×2
GAS AUTO FILL CONSTELLATION (OPHTHALMIC) ×1 IMPLANT
GAS WRIST BAND GREEN (MISCELLANEOUS) ×1
GLOVE SURG SYN 7.5  E (GLOVE) ×1
GLOVE SURG SYN 7.5 E (GLOVE) ×1 IMPLANT
GOWN STRL REUS W/ TWL LRG LVL3 (GOWN DISPOSABLE) ×2 IMPLANT
GOWN STRL REUS W/TWL LRG LVL3 (GOWN DISPOSABLE) ×2
KIT BASIN OR (CUSTOM PROCEDURE TRAY) ×2 IMPLANT
KIT TURNOVER KIT B (KITS) ×2 IMPLANT
LENS BIOM SUPER VIEW SET DISP (MISCELLANEOUS) ×2 IMPLANT
MICROPICK 25G (MISCELLANEOUS)
NEEDLE 18GX1X1/2 (RX/OR ONLY) (NEEDLE) ×2 IMPLANT
NEEDLE 25GX 5/8IN NON SAFETY (NEEDLE) ×2 IMPLANT
NEEDLE FILTER BLUNT 18X 1/2SAF (NEEDLE) ×1
NEEDLE FILTER BLUNT 18X1 1/2 (NEEDLE) ×1 IMPLANT
NEEDLE HYPO 25GX1X1/2 BEV (NEEDLE) IMPLANT
NEEDLE HYPO 30X.5 LL (NEEDLE) ×4 IMPLANT
NEEDLE RETROBULBAR 25GX1.5 (NEEDLE) ×2 IMPLANT
NS IRRIG 1000ML POUR BTL (IV SOLUTION) ×2 IMPLANT
PACK FRAGMATOME (OPHTHALMIC) IMPLANT
PACK VITRECTOMY CUSTOM (CUSTOM PROCEDURE TRAY) ×2 IMPLANT
PAD ARMBOARD 7.5X6 YLW CONV (MISCELLANEOUS) ×4 IMPLANT
PAK PIK VITRECTOMY CVS 25GA (OPHTHALMIC) ×2 IMPLANT
PICK MICROPICK 25G (MISCELLANEOUS) IMPLANT
PROBE ENDO DIATHERMY 25G (MISCELLANEOUS) IMPLANT
PROBE LASER ILLUM FLEX CVD 25G (OPHTHALMIC) ×4 IMPLANT
ROLLS DENTAL (MISCELLANEOUS) IMPLANT
SCRAPER DIAMOND 25GA (OPHTHALMIC RELATED) IMPLANT
SHIELD EYE LENSE ONLY DISP (GAUZE/BANDAGES/DRESSINGS) ×2 IMPLANT
SOL ANTI FOG 6CC (MISCELLANEOUS) ×1 IMPLANT
SOLUTION ANTI FOG 6CC (MISCELLANEOUS) ×1
STOPCOCK 4 WAY LG BORE MALE ST (IV SETS) ×2 IMPLANT
SUT VICRYL 7 0 TG140 8 (SUTURE) IMPLANT
SUT VICRYL 8 0 TG140 8 (SUTURE) IMPLANT
SYR 10ML LL (SYRINGE) IMPLANT
SYR 20ML LL LF (SYRINGE) ×2 IMPLANT
SYR 5ML LL (SYRINGE) ×2 IMPLANT
SYR TB 1ML LUER SLIP (SYRINGE) IMPLANT
WATER STERILE IRR 1000ML POUR (IV SOLUTION) ×2 IMPLANT

## 2020-11-15 NOTE — Anesthesia Postprocedure Evaluation (Signed)
Anesthesia Post Note  Patient: Christopher Frederick  Procedure(s) Performed: PARS PLANA VITRECTOMY WITH 25 GAUGE, ENDOLASER, AND GAS (Right )     Patient location during evaluation: PACU Anesthesia Type: MAC Level of consciousness: awake and alert Pain management: pain level controlled Vital Signs Assessment: post-procedure vital signs reviewed and stable Respiratory status: spontaneous breathing, nonlabored ventilation and respiratory function stable Cardiovascular status: stable and blood pressure returned to baseline Postop Assessment: no apparent nausea or vomiting Anesthetic complications: no   No complications documented.  Last Vitals:  Vitals:   11/15/20 2040 11/15/20 2045  BP: (!) 126/57 124/60  Pulse: 65   Resp: 12   Temp:  36.8 C  SpO2: 98%     Last Pain:  Vitals:   11/15/20 2040  TempSrc:   PainSc: 0-No pain                 FITZGERALD,W. EDMOND

## 2020-11-15 NOTE — Discharge Instructions (Addendum)
DO NOT SLEEP ON BACK, THE EYE PRESSURE CAN GO UP AND CAUSE VISION LOSS   SLEEP ON SIDE WITH NOSE TO PILLOW  DURING DAY KEEP FACE DOWN.  15 MINUTES EVERY 2 HOURS IT IS OK TO LOOK STRAIGHT AHEAD (USE BATHROOM, EAT, WALK, ETC.)  

## 2020-11-15 NOTE — Op Note (Signed)
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  A lid speculum was placed.  Infusion line and trocar was placed at the 8 o'clock position approximately 3.5 mm from the surgical limbus.   The infusion line was allowed to run and then clamped when placed at the cannula opening. The line was inserted and secured to the drape with an adhesive strip.   Active trocars/cannula were placed at the 10 and 2 o'clock positions approximately 3.5 mm from the surgical limbus. The cannula was visualized in the vitreous cavity.  The light pipe and vitreous cutter were inserted into the vitreous cavity and a core vitrectomy was performed.  Care taken to remove the vitreous up to the vitreous base for 360 degrees taking care to carefully shave the vitreous over the detached retina and over lattice degeneration.  The detached retina extended from 9:30 to 12:00 with multiple breaks.  3 rows of endolaser were applied 360 degrees to the periphery.  A complete air-fluid exchange was performed and additional endlaser was applied over the detached retina.  16% SF6 gas was placed in the eye.  The superior cannulas were sequentially removed with concommitant tamponade using a cotton tipped applicator and noted to be air tight.  The infusion line and trocar were removed and the sclerotomy was noted to be air tight with normal intraocular pressure by digital palpapation.  Subconjunctival injections of Ancef and Dexamethasone 4mg /37ml were placed in the infero-medial quadrant.   The speculum and drapes were removed and the eye was patched with Polymixin/Bacitracin  ophthalmic ointment. An eye shield was placed and the patient was transferred alert and conversant with stable vital signs to the post operative recovery area.  The patient tolerated the procedure well and no complications were noted.  0m MD

## 2020-11-15 NOTE — Progress Notes (Signed)
Confirmed with lab that COVID test is negative.

## 2020-11-15 NOTE — Anesthesia Preprocedure Evaluation (Addendum)
Anesthesia Evaluation  Patient identified by MRN, date of birth, ID band Patient awake    Reviewed: Allergy & Precautions, H&P , NPO status , Patient's Chart, lab work & pertinent test results  Airway Mallampati: II  TM Distance: >3 FB Neck ROM: Full    Dental no notable dental hx. (+) Teeth Intact, Dental Advisory Given   Pulmonary neg pulmonary ROS,    Pulmonary exam normal breath sounds clear to auscultation       Cardiovascular negative cardio ROS   Rhythm:Regular Rate:Normal     Neuro/Psych negative neurological ROS  negative psych ROS   GI/Hepatic negative GI ROS, Neg liver ROS,   Endo/Other  negative endocrine ROS  Renal/GU negative Renal ROS  negative genitourinary   Musculoskeletal   Abdominal   Peds  Hematology negative hematology ROS (+)   Anesthesia Other Findings   Reproductive/Obstetrics negative OB ROS                            Anesthesia Physical Anesthesia Plan  ASA: I  Anesthesia Plan: MAC   Post-op Pain Management:    Induction: Intravenous  PONV Risk Score and Plan: 2 and Propofol infusion and Ondansetron  Airway Management Planned: Nasal Cannula  Additional Equipment:   Intra-op Plan:   Post-operative Plan:   Informed Consent: I have reviewed the patients History and Physical, chart, labs and discussed the procedure including the risks, benefits and alternatives for the proposed anesthesia with the patient or authorized representative who has indicated his/her understanding and acceptance.     Dental advisory given  Plan Discussed with: CRNA  Anesthesia Plan Comments:        Anesthesia Quick Evaluation

## 2020-11-15 NOTE — Transfer of Care (Signed)
Immediate Anesthesia Transfer of Care Note  Patient: Christopher Frederick  Procedure(s) Performed: PARS PLANA VITRECTOMY WITH 25 GAUGE, ENDOLASER, AND GAS (Right )  Patient Location: PACU  Anesthesia Type:MAC  Level of Consciousness: awake, alert  and oriented  Airway & Oxygen Therapy: Patient Spontanous Breathing and Patient connected to nasal cannula oxygen  Post-op Assessment: Report given to RN, Post -op Vital signs reviewed and stable and Patient moving all extremities  Post vital signs: Reviewed and stable  Last Vitals:  Vitals Value Taken Time  BP 131/82 11/15/20 2007  Temp    Pulse 68 11/15/20 2011  Resp 13 11/15/20 2011  SpO2 100 % 11/15/20 2011  Vitals shown include unvalidated device data.  Last Pain:  Vitals:   11/15/20 1657  TempSrc: Oral  PainSc: 0-No pain      Patients Stated Pain Goal: 4 (75/43/60 6770)  Complications: No complications documented.

## 2020-11-15 NOTE — Brief Op Note (Signed)
11/15/2020  8:00 PM  PATIENT:  Christopher Frederick  50 y.o. male  PRE-OPERATIVE DIAGNOSIS:  retinal detachment right eye with multiple breaks  POST-OPERATIVE DIAGNOSIS:  retinal detachment right eye with multiple breaks  PROCEDURE:  Procedure(s): PARS PLANA VITRECTOMY WITH 25 GAUGE, ENDOLASER, AND GAS (Right)  SURGEON:  Surgeon(s) and Role:    Jalene Mullet, MD - Primary  PHYSICIAN ASSISTANT:   ASSISTANTS: none   ANESTHESIA:   local and MAC  EBL:  Minimal   BLOOD ADMINISTERED:none  DRAINS: none   LOCAL MEDICATIONS USED:  MARCAINE    and LIDOCAINE   SPECIMEN:  No Specimen  DISPOSITION OF SPECIMEN:  N/A  COUNTS:  YES  TOURNIQUET:  * No tourniquets in log *  DICTATION: .Note written in EPIC  PLAN OF CARE: Discharge to home after PACU  PATIENT DISPOSITION:  PACU - hemodynamically stable.   Delay start of Pharmacological VTE agent (>24hrs) due to surgical blood loss or risk of bleeding: not applicable

## 2020-11-15 NOTE — H&P (Signed)
Date of examination:  11/15/20  Indication for surgery: retinal detachment right eye  Pertinent past medical history:  Past Medical History:  Diagnosis Date  . Asthma    as a child  . Macular degeneration   . Retinal tear of left eye   . Urinary urgency    Takes Ditropan    Pertinent ocular history:  Lattice degeneration right eye  Pertinent family history:  Family History  Problem Relation Age of Onset  . Crohn's disease Father     General:  Healthy appearing patient in no distress.    Eyes:    Acuity OD 20/30-2 (PH) OS   External: Within normal limits  Anterior segment: Within normal limits      Fundus: superotemporal retinal detachment right eye     Impression: Retinal detachment right eye  Plan: Retinal detachment repair right eye  Carmela Rima, MD

## 2020-11-15 NOTE — H&P (Deleted)
  The note originally documented on this encounter has been moved the the encounter in which it belongs.  

## 2020-11-16 ENCOUNTER — Encounter (HOSPITAL_COMMUNITY): Payer: Self-pay | Admitting: Ophthalmology

## 2020-11-20 LAB — SARS CORONAVIRUS 2 BY RT PCR (HOSPITAL ORDER, PERFORMED IN ~~LOC~~ HOSPITAL LAB): SARS Coronavirus 2: NEGATIVE

## 2021-11-29 IMAGING — MR MR KNEE*R* W/O CM
4 of 6 series · 23 of 40 positions shown · non-contrast
Comparison: None.

CLINICAL DATA: Right knee pain.

EXAM:
MRI OF THE RIGHT KNEE WITHOUT CONTRAST
TECHNIQUE: Multiplanar, multisequence MR imaging of the knee was performed. No
intravenous contrast was administered.

[Series 4: T1 · coronal · 4.0mm · 0.33mm/px · 3 of 29 slices shown]
[im 6/29]
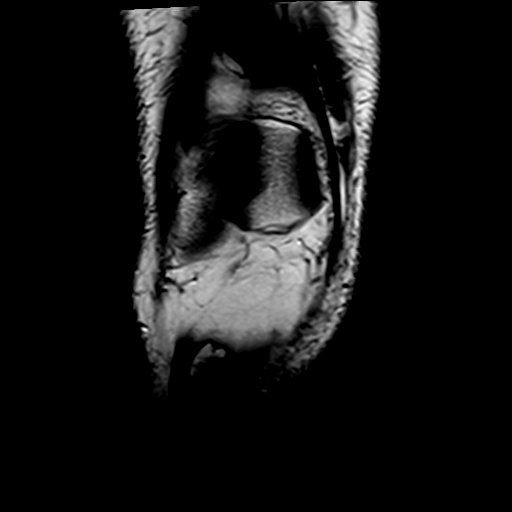
[im 17/29]
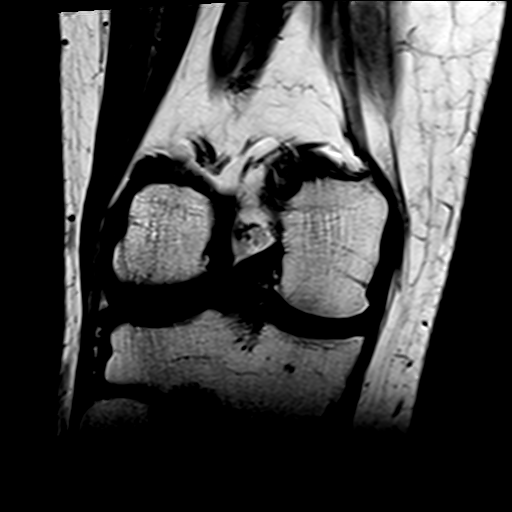
[im 29/29]
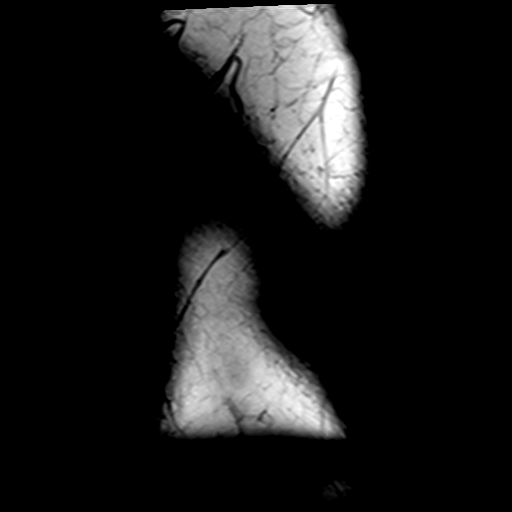

[Series 5: T2 fat-sat · coronal · 4.0mm · 0.66mm/px · 6 of 29 slices shown (1 of 2)]
[im 1/29]
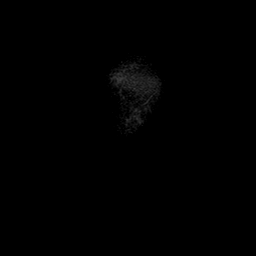
[im 6/29]
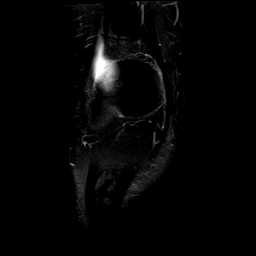
[im 12/29]
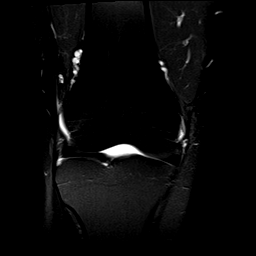
[im 17/29]
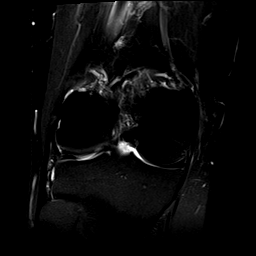
[im 23/29]
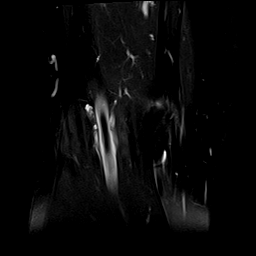
[im 29/29]
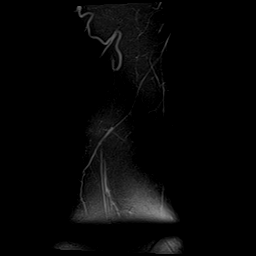

[Series 7: PD fat-sat · sagittal · 3.0mm · 0.33mm/px · 7 of 32 slices shown]
[im 1/32]
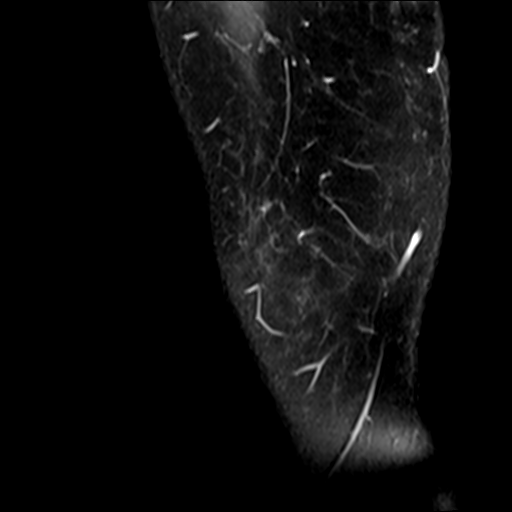
[im 6/32]
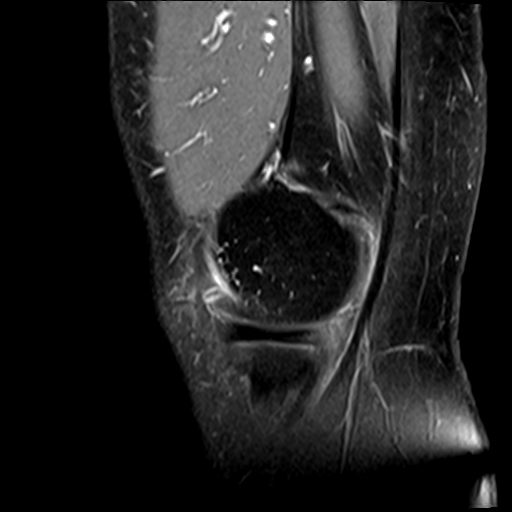
[im 11/32]
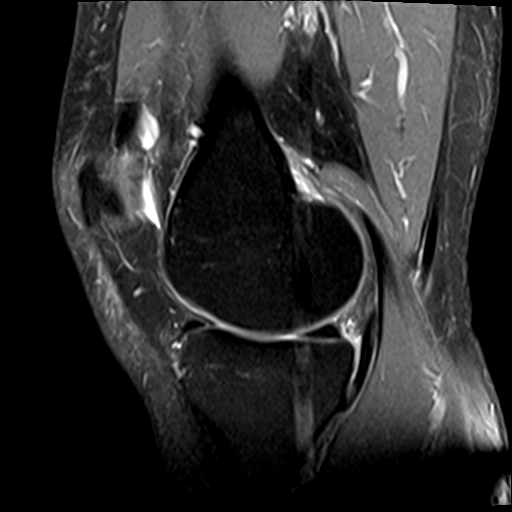
[im 16/32]
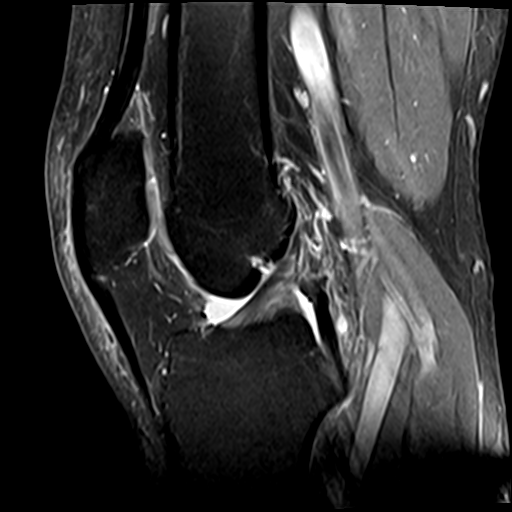
[im 21/32]
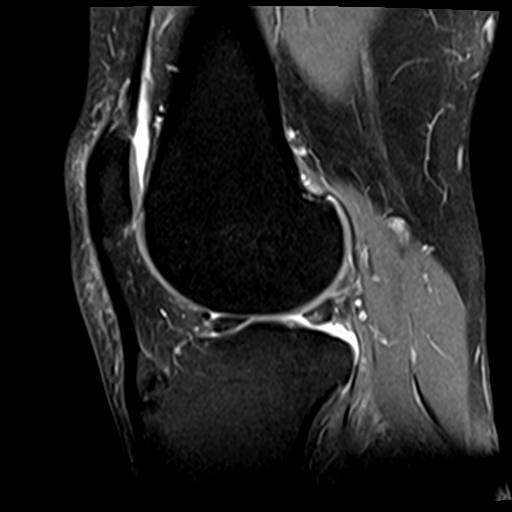
[im 26/32]
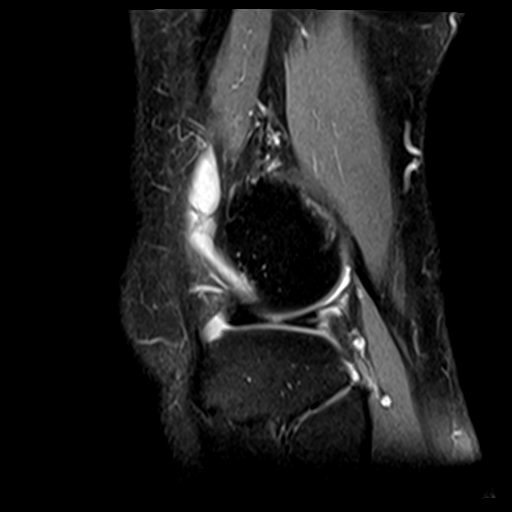
[im 32/32]
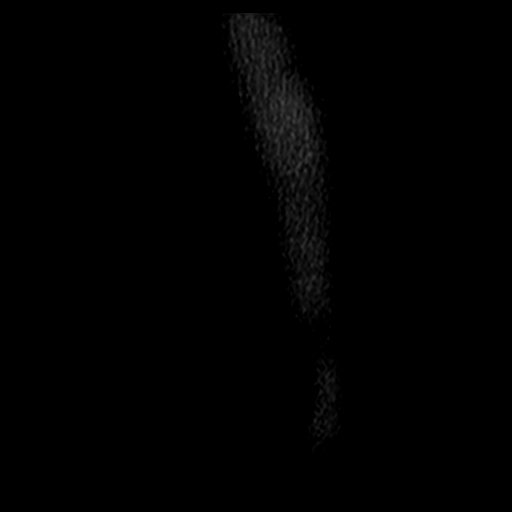

[Series 8: T2 fat-sat · sagittal · 3.0mm · 0.29mm/px · 7 of 32 slices shown (2 of 2)]
[im 1/32]
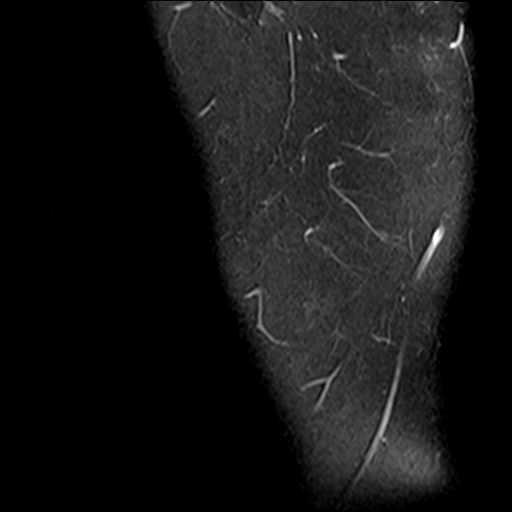
[im 6/32]
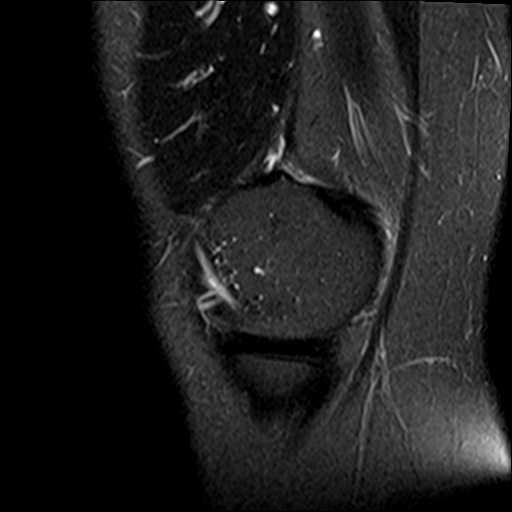
[im 11/32]
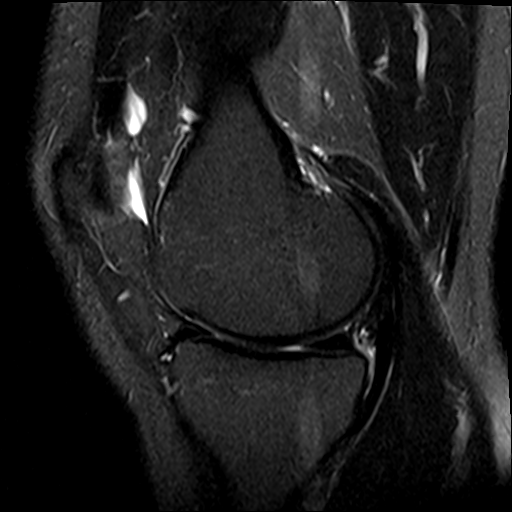
[im 16/32]
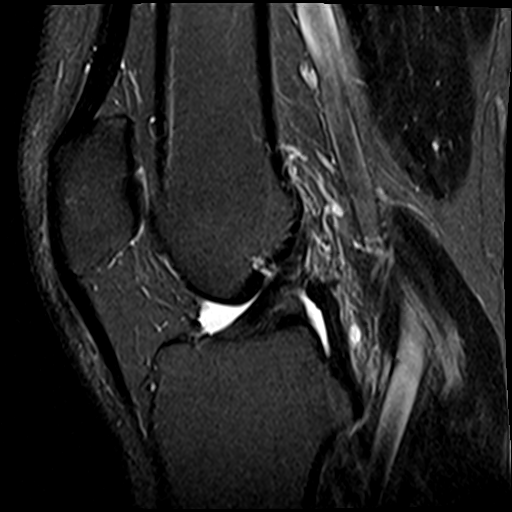
[im 21/32]
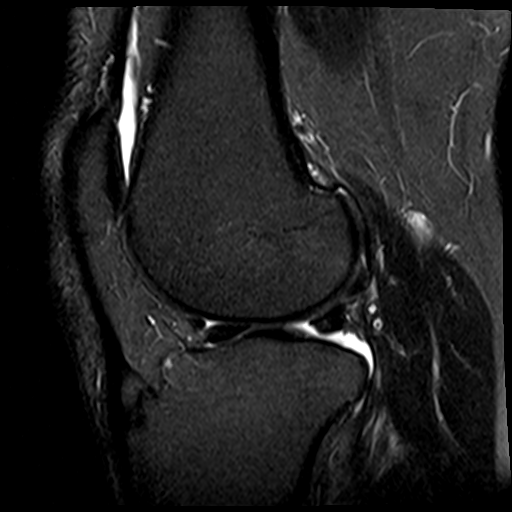
[im 26/32]
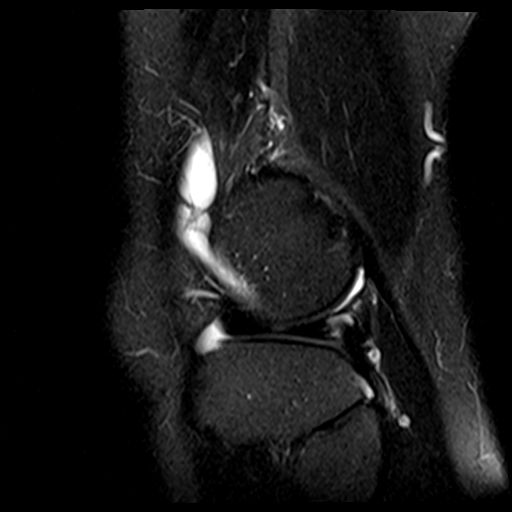
[im 32/32]
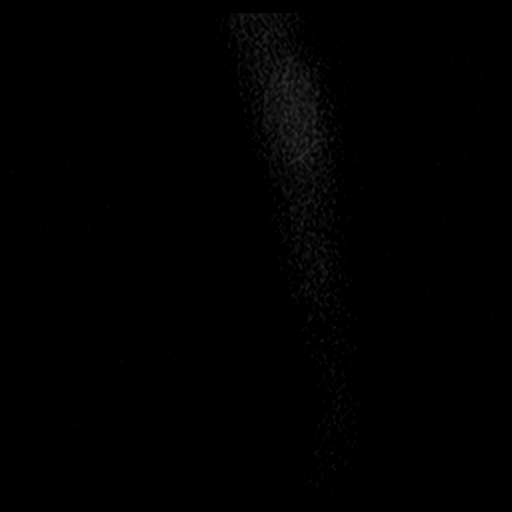

[23 of 40 positions shown; findings below may reference images not displayed]

FINDINGS: MENISCI

Medial: Intact.

Lateral: Horizontal tear of the posterior horn-body junction of the
lateral meniscus extending to the free edge and extends into the
posterior horn.

LIGAMENTS

Cruciates: ACL and PCL are intact.

Collaterals: Medial collateral ligament is intact. Lateral
collateral ligament complex is intact.

CARTILAGE

Patellofemoral: Partial-thickness cartilage loss of the lateral
patellar facet.

Medial:  No chondral defect.

Lateral: Partial-thickness cartilage loss of the lateral
femorotibial compartment mild subchondral reactive marrow changes of
the lateral tibial plateau.

JOINT: No joint effusion. Normal Fallon Jim. No plical
thickening.

POPLITEAL FOSSA: Popliteus tendon is intact. No Baker's cyst.

EXTENSOR MECHANISM: Intact quadriceps tendon. Intact patellar
tendon. Intact lateral patellar retinaculum. Intact medial patellar
retinaculum. Intact MPFL.

BONES: No aggressive osseous lesion. No fracture or dislocation.

Other: No fluid collection or hematoma. Muscles are normal.
IMPRESSION: 1. Horizontal tear of the posterior horn-body junction of the
lateral meniscus extending to the free edge and extends into the
posterior horn.
2. Partial-thickness cartilage loss of the lateral femorotibial
compartment mild subchondral reactive marrow changes of the lateral
tibial plateau.
3. Partial-thickness cartilage loss of the lateral patellar facet.

## 2022-01-07 ENCOUNTER — Ambulatory Visit (INDEPENDENT_AMBULATORY_CARE_PROVIDER_SITE_OTHER): Payer: BC Managed Care – PPO

## 2022-01-07 ENCOUNTER — Ambulatory Visit (HOSPITAL_BASED_OUTPATIENT_CLINIC_OR_DEPARTMENT_OTHER): Payer: BC Managed Care – PPO | Admitting: Orthopaedic Surgery

## 2022-01-07 ENCOUNTER — Other Ambulatory Visit (HOSPITAL_BASED_OUTPATIENT_CLINIC_OR_DEPARTMENT_OTHER): Payer: Self-pay | Admitting: Orthopaedic Surgery

## 2022-01-07 DIAGNOSIS — M25522 Pain in left elbow: Secondary | ICD-10-CM | POA: Diagnosis not present

## 2022-01-07 DIAGNOSIS — M25829 Other specified joint disorders, unspecified elbow: Secondary | ICD-10-CM

## 2022-01-07 DIAGNOSIS — M25521 Pain in right elbow: Secondary | ICD-10-CM | POA: Diagnosis not present

## 2022-01-07 DIAGNOSIS — M7702 Medial epicondylitis, left elbow: Secondary | ICD-10-CM | POA: Diagnosis not present

## 2022-01-07 MED ORDER — TRIAMCINOLONE ACETONIDE 40 MG/ML IJ SUSP
80.0000 mg | INTRAMUSCULAR | Status: AC | PRN
  Administered 2022-01-07: 80 mg via INTRA_ARTICULAR

## 2022-01-07 MED ORDER — LIDOCAINE HCL 1 % IJ SOLN
4.0000 mL | INTRAMUSCULAR | Status: AC | PRN
  Administered 2022-01-07: 4 mL

## 2022-01-07 NOTE — Progress Notes (Signed)
? ?                            ? ? ?Chief Complaint: Bilateral elbow pain ?  ? ? ?History of Present Illness:  ? ? ?Christopher Frederick is a 51 y.o. male right-hand-dominant male.  Presents with bilateral elbow pain.  He is needed meds Development worker, [employer] golf coach.  He states that with regard to the left elbow that this has been occurring about the medial aspect of the elbow.  This is making the smaller 3 fingers feel weak.  He has tried multiple treatments including TENS and chiropractic manipulation as well as heat.  This occurs usually during the season when begins to ramp back up.  With regard to the right he is experiencing pain in the posterior aspect of the elbow about the olecranon.  This is very similar and painful when he ramped back up in the season.  He has been taking ibuprofen and Tylenol. ? ? ? ?Surgical History:   ?None ? ?PMH/PSH/Family History/Social History/Meds/Allergies:   ? ?Past Medical History:  ?Diagnosis Date  ?? Asthma   ? as a child  ?? Macular degeneration   ?? Retinal tear of left eye   ?? Urinary urgency   ? Takes Ditropan  ? ?Past Surgical History:  ?Procedure Laterality Date  ?? CATARACT EXTRACTION, BILATERAL Bilateral   ? left in may 2021, right  10/21  ?? EYE SURGERY Left   ?? EYE SURGERY Left 01/2020  ? sewed a lens in the eye-cataract surgery  ?? GAS INSERTION Left 12/18/2014  ? Procedure: INSERTION OF GAS;  Surgeon: Carmela RimaNarendra Patel, MD;  [facility];  Service: Ophthalmology;  Laterality: Left;  SF6  ?? GAS/FLUID EXCHANGE Left 12/18/2014  ? Procedure: GAS/FLUID EXCHANGE;  Surgeon: Carmela RimaNarendra Patel, MD;  [facility];  Service: Ophthalmology;  Laterality: Left;  ?? KNEE SURGERY Right 08/2020  ? arthroscopy  ?? PARS PLANA VITRECTOMY Left 12/18/2014  ? Procedure: VITRECTOMY 25 GAUGE WITH REMOVAL OF SCLERAL BUCKLE;  Surgeon: Carmela RimaNarendra Patel, MD;  [facility];  Service: Ophthalmology;  Laterality: Left;  ?? PARS PLANA VITRECTOMY Right 11/15/2020  ? Procedure: PARS PLANA VITRECTOMY WITH 25  GAUGE, ENDOLASER, AND GAS;  Surgeon: Carmela RimaPatel, Narendra, MD;  [facility];  Service: Ophthalmology;  Laterality: Right;  ?? PERFLUORONE INJECTION Left 12/18/2014  ? Procedure: PERFLUORONE INJECTION;  Surgeon: Carmela RimaNarendra Patel, MD;  [facility];  Service: Ophthalmology;  Laterality: Left;  ? ?Social History  ? ?Socioeconomic History  ?? Marital status: Married  ?  Spouse name: Not on file  ?? Number of children: Not on file  ?? Years of education: Not on file  ?? Highest education level: Not on file  ?Occupational History  ?? Not on file  ?Tobacco Use  ?? Smoking status: Never  ?? Smokeless tobacco: Never  ?Substance and Sexual Activity  ?? Alcohol use: Yes  ?  Alcohol/week: 6.0 standard drinks  ?  Types: 6 Glasses of wine per week  ?  Comment: occasionally  ?? Drug use: No  ?? Sexual activity: Not on file  ?Other Topics Concern  ?? Not on file  ?Social History Narrative  ?? Not on file  ? ?Social Determinants of Health  ? ?Financial Resource Strain: Not on file  ?Food Insecurity: Not on file  ?Transportation Needs: Not on file  ?Physical Activity: Not on file  ?Stress: Not on file  ?Social Connections: Not on  file  ? ?Family History  ?Problem Relation Age of Onset  ?? Crohn's disease Father   ? ?No Known Allergies ?Current Outpatient Medications  ?Medication Sig Dispense Refill  ?? diclofenac (VOLTAREN) 75 MG EC tablet Take 1 tablet (75 mg total) by mouth 2 (two) times daily. Take tablet 2 times daily for 2 weeks then 1 times daily for 2 weeks then daily as needed 60 tablet 0  ?? ILEVRO 0.3 % ophthalmic suspension INSTILL 1 DROP IN LEFT EYE DAILY  0  ?? LOTEMAX 0.5 % GEL     ?? OVER THE COUNTER MEDICATION Take 1 tablet by mouth daily. Reported on 09/11/2015    ?? oxybutynin (DITROPAN-XL) 10 MG 24 hr tablet TAKE 1 TABLET BY MOUTH DAILY 90 tablet 0  ?? RESTASIS 0.05 % ophthalmic emulsion INSTILL 1 DROP IN BOTH EYES BID  4  ?? tiZANidine (ZANAFLEX) 4 MG tablet TAKE 1 TABLET BY MOUTH 3 TIMES DAILY. 90 tablet 0  ? ?No  current facility-administered medications for this visit.  ? ?No results found. ? ?Review of Systems:   ?A ROS was performed including pertinent positives and negatives as documented in the HPI. ? ?Physical Exam :   ?Constitutional: NAD and appears stated age ?Neurological: Alert and oriented ?Psych: Appropriate affect and cooperative ?There were no vitals taken for this visit.  ? ?Comprehensive Musculoskeletal Exam:   ? ?Tenderness palpation about left medial epicondyle.  Pain with resisted flexion of the left wrist.  2+ radial pulse.  Regard to the right elbow is tender about the posterior olecranon fossa.  There is tenderness with direct forced extension of the elbow.  He has full pro supination.  No tenderness about the medial or lateral epicondyle ? ?Imaging:   ?Xray (x-rays right elbow 2 views, x-ray left elbow 2 views): ?Normal ? ? ?I personally reviewed and interpreted the radiographs. ? ? 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  Specifically given the fact he has tried multiple treatments with little relief I have recommended an ultrasound-guided injection of the left medial epicondyle as well as the right intra-articular elbow as he is experiencing most of his pain about the posterior olecranon. ? ?Plan :   ? ?-Bilateral elbow injections performed today after verbal consent obtained ? ? ? ? ?Procedure Note ? ?Patient: Christopher Frederick             ?Date of Birth: Oct 02, 1970           ?MRN: [id number]             ?Visit Date: 01/07/2022 ? ?Procedures: ?Visit Diagnoses:  ?1. Pain in left elbow   ?2. Pain in right elbow   ?3. Golfer's elbow, left   ?4. Olecranon impingement syndrome   ? ? ?Medium Joint Inj: L elbow on 01/07/2022 3:04 PM ?Indications: pain ?Details: 22 G 1.5 in needle, ultrasound-guided lateral approach ?Medications: 4 mL lidocaine 1 %; 80 mg triamcinolone  acetonide 40 MG/ML ?Outcome: tolerated well, no immediate complications ?Consent was given by the patient. Immediately prior to procedure a time out was called to verify the correct patient, procedure, equipment, support staff and site/side marked as required. Patient was prepped and draped in the usual sterile fashion.  ? ? ?Medium Joint Inj: R elbow on 01/07/2022 3:04 PM ?Indications: pain ?Details: 22 G 1.5 in needle, ultrasound-guided lateral approach ?Medications: 4 mL lidocaine 1 %;  80 mg triamcinolone acetonide 40 MG/ML ?Outcome: tolerated well, no immediate complications ?Consent was given by the patient. Immediately prior to procedure a time out was called to verify the correct patient, procedure, equipment, support staff and site/side marked as required. Patient was prepped and draped in the usual sterile fashion.  ? ? ? ? ? ?I personally saw and evaluated the patient, and participated in the management and treatment plan. ? ?Huel Cote, MD ?Attending Physician, Orthopedic Surgery ? ?This document was dictated using Conservation officer, historic buildings. A reasonable attempt at proof reading has been made to minimize errors. ?

## 2022-05-12 ENCOUNTER — Encounter: Payer: Self-pay | Admitting: Internal Medicine

## 2022-06-09 ENCOUNTER — Ambulatory Visit (INDEPENDENT_AMBULATORY_CARE_PROVIDER_SITE_OTHER): Payer: BC Managed Care – PPO | Admitting: Orthopaedic Surgery

## 2022-06-09 DIAGNOSIS — M7702 Medial epicondylitis, left elbow: Secondary | ICD-10-CM | POA: Diagnosis not present

## 2022-06-09 DIAGNOSIS — M25522 Pain in left elbow: Secondary | ICD-10-CM | POA: Diagnosis not present

## 2022-06-09 MED ORDER — TRIAMCINOLONE ACETONIDE 40 MG/ML IJ SUSP
80.0000 mg | INTRAMUSCULAR | Status: AC | PRN
  Administered 2022-06-09: 80 mg via INTRA_ARTICULAR

## 2022-06-09 MED ORDER — LIDOCAINE HCL 1 % IJ SOLN
4.0000 mL | INTRAMUSCULAR | Status: AC | PRN
  Administered 2022-06-09: 4 mL

## 2022-06-09 NOTE — Progress Notes (Signed)
Chief Complaint: Bilateral elbow pain     History of Present Illness:   06/09/2022: Presents today for follow-up of the left elbow as well as right elbow.  He states that he did get significant relief for many months after his previous injections in both elbows.  He did recently switch to steel shaft from graphite and has noticed persistent medial based elbow pain since this time.  He is here today hoping to discuss further treatment for his medial epicondylitis  Christopher Frederick is a 51 y.o. male right-hand-dominant male.  Presents with bilateral elbow pain.  He is needed meds Forensic scientist.  He states that with regard to the left elbow that this has been occurring about the medial aspect of the elbow.  This is making the smaller 3 fingers feel weak.  He has tried multiple treatments including TENS and chiropractic manipulation as well as heat.  This occurs usually during the season when begins to ramp back up.  With regard to the right he is experiencing pain in the posterior aspect of the elbow about the olecranon.  This is very similar and painful when he ramped back up in the season.  He has been taking ibuprofen and Tylenol.    Surgical History:   None  PMH/PSH/Family History/Social History/Meds/Allergies:    Past Medical History:  Diagnosis Date   Asthma    as a child   Macular degeneration    Retinal tear of left eye    Urinary urgency    Takes Ditropan   Past Surgical History:  Procedure Laterality Date   CATARACT EXTRACTION, BILATERAL Bilateral    left in may 2021, right  10/21   EYE SURGERY Left    EYE SURGERY Left 01/2020   sewed a lens in the eye-cataract surgery   GAS INSERTION Left 12/18/2014   Procedure: INSERTION OF GAS;  Surgeon: Jalene Mullet, MD;  Location: [place];  Service: Ophthalmology;  Laterality: Left;  SF6   GAS/FLUID EXCHANGE Left 12/18/2014   Procedure: GAS/FLUID EXCHANGE;  Surgeon: Jalene Mullet, MD;   Location: [place];  Service: Ophthalmology;  Laterality: Left;   KNEE SURGERY Right 08/2020   arthroscopy   PARS PLANA VITRECTOMY Left 12/18/2014   Procedure: VITRECTOMY 25 GAUGE WITH REMOVAL OF SCLERAL BUCKLE;  Surgeon: Jalene Mullet, MD;  Location: [place];  Service: Ophthalmology;  Laterality: Left;   PARS PLANA VITRECTOMY Right 11/15/2020   Procedure: PARS PLANA VITRECTOMY WITH 25 GAUGE, ENDOLASER, AND GAS;  Surgeon: Jalene Mullet, MD;  Location: [place];  Service: Ophthalmology;  Laterality: Right;   PERFLUORONE INJECTION Left 12/18/2014   Procedure: PERFLUORONE INJECTION;  Surgeon: Jalene Mullet, MD;  Location: [place];  Service: Ophthalmology;  Laterality: Left;   Social History   Socioeconomic History   Marital status: Married    Spouse name: Not on file   Number of children: Not on file   Years of education: Not on file   Highest education level: Not on file  Occupational History   Not on file  Tobacco Use   Smoking status: Never   Smokeless tobacco: Never  Substance and Sexual Activity   Alcohol use: Yes    Alcohol/week: 6.0 standard drinks of alcohol    Types: 6 Glasses of wine per week    Comment: occasionally  Drug use: No   Sexual activity: Not on file  Other Topics Concern   Not on file  Social History Narrative   Not on file   Social Determinants of Health   Financial Resource Strain: Not on file  Food Insecurity: Not on file  Transportation Needs: Not on file  Physical Activity: Not on file  Stress: Not on file  Social Connections: Not on file   Family History  Problem Relation Age of Onset   Crohn's disease Father    No Known Allergies Current Outpatient Medications  Medication Sig Dispense Refill   diclofenac (VOLTAREN) 75 MG EC tablet Take 1 tablet (75 mg total) by mouth 2 (two) times daily. Take tablet 2 times daily for 2 weeks then 1 times daily for 2 weeks then daily as needed 60 tablet 0   ILEVRO 0.3 % ophthalmic suspension INSTILL 1 DROP IN LEFT  EYE DAILY  0   LOTEMAX 0.5 % GEL      OVER THE COUNTER MEDICATION Take 1 tablet by mouth daily. Reported on 09/11/2015     oxybutynin (DITROPAN-XL) 10 MG 24 hr tablet TAKE 1 TABLET BY MOUTH DAILY 90 tablet 0   RESTASIS 0.05 % ophthalmic emulsion INSTILL 1 DROP IN BOTH EYES BID  4   tiZANidine (ZANAFLEX) 4 MG tablet TAKE 1 TABLET BY MOUTH 3 TIMES DAILY. 90 tablet 0   No current facility-administered medications for this visit.   No results found.  Review of Systems:   A ROS was performed including pertinent positives and negatives as documented in the HPI.  Physical Exam :   Constitutional: NAD and appears stated age Neurological: Alert and oriented Psych: Appropriate affect and cooperative There were no vitals taken for this visit.   Comprehensive Musculoskeletal Exam:    Tenderness palpation about left medial epicondyle.  Pain with resisted flexion of the left wrist.  2+ radial pulse.  Regard to the right elbow is tender about the posterior olecranon fossa.  There is tenderness with direct forced extension of the elbow.  He has full pro supination.  No tenderness about the medial or lateral epicondyle  Imaging:   Xray (x-rays right elbow 2 views, x-ray left elbow 2 views): Normal   I personally reviewed and interpreted the radiographs.   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  Overall his right elbow is feeling much better after reduction although his left elbow has become persistently painful again after switching to steel shafts.  At this time he has plan to switch back to graphite.  He is considering a left medial epicondyle debridement and repair which I do believe is reasonable as he has he has now failed multiple injections as well as stretching exercises.  We will plan to proceed with this likely in December.  He is in the meantime hoping to obtain  an additional medial epicondyle injection for pain relief as well.  Plan :    -Plan for left medial epicondyle debridement and repair    After a lengthy discussion of treatment options, including risks, benefits, alternatives, complications of surgical and nonsurgical conservative options, the patient elected surgical repair.   The patient  is aware of the material risks  and complications including, but not limited to injury to adjacent structures, neurovascular injury, infection, numbness, bleeding, implant failure, thermal burns, stiffness, persistent pain, failure to heal, disease transmission from allograft, need  for further surgery, dislocation, anesthetic risks, blood clots, risks of death,and others. The probabilities of surgical success and failure discussed with patient given their particular co-morbidities.The time and nature of expected rehabilitation and recovery was discussed.The patient's questions were all answered preoperatively.  No barriers to understanding were noted. I explained the natural history of the disease process and Rx rationale.  I explained to the patient what I considered to be reasonable expectations given their personal situation.  The final treatment plan was arrived at through a shared patient decision making process model.      Procedure Note  Patient: Christopher Frederick             Date of Birth: 03-Nov-1970           MRN: [id number]             Visit Date: 06/09/2022  Procedures: Visit Diagnoses:  No diagnosis found.   Medium Joint Inj: L elbow on 06/09/2022 10:42 AM Indications: pain Details: 22 G 1.5 in needle, ultrasound-guided lateral approach Medications: 4 mL lidocaine 1 %; 80 mg triamcinolone acetonide 40 MG/ML Outcome: tolerated well, no immediate complications Consent was given by the patient. Immediately prior to procedure a time out was called to verify the correct patient, procedure, equipment, support staff and site/side marked  as required. Patient was prepped and draped in the usual sterile fashion.       I personally saw and evaluated the patient, and participated in the management and treatment plan.  Vanetta Mulders, MD Attending Physician, Orthopedic Surgery  This document was dictated using Dragon voice recognition software. A reasonable attempt at proof reading has been made to minimize errors.

## 2022-06-11 ENCOUNTER — Ambulatory Visit (HOSPITAL_BASED_OUTPATIENT_CLINIC_OR_DEPARTMENT_OTHER): Payer: Self-pay | Admitting: Orthopaedic Surgery

## 2022-06-11 DIAGNOSIS — M7702 Medial epicondylitis, left elbow: Secondary | ICD-10-CM

## 2022-06-15 ENCOUNTER — Encounter: Payer: Self-pay | Admitting: Internal Medicine

## 2022-06-28 ENCOUNTER — Encounter: Payer: Self-pay | Admitting: Internal Medicine

## 2022-08-17 ENCOUNTER — Encounter (HOSPITAL_BASED_OUTPATIENT_CLINIC_OR_DEPARTMENT_OTHER): Payer: Self-pay

## 2022-08-17 ENCOUNTER — Ambulatory Visit (HOSPITAL_BASED_OUTPATIENT_CLINIC_OR_DEPARTMENT_OTHER): Admit: 2022-08-17 | Payer: BC Managed Care – PPO | Admitting: Orthopaedic Surgery

## 2022-08-17 SURGERY — ARTHROSCOPY, ELBOW, WITH OPEN SURGERY IF INDICATED
Anesthesia: Regional | Site: Elbow | Laterality: Left

## 2022-09-01 ENCOUNTER — Encounter (HOSPITAL_BASED_OUTPATIENT_CLINIC_OR_DEPARTMENT_OTHER): Payer: BC Managed Care – PPO | Admitting: Orthopaedic Surgery

## 2022-12-01 ENCOUNTER — Other Ambulatory Visit: Payer: Self-pay | Admitting: Urology

## 2023-01-31 ENCOUNTER — Other Ambulatory Visit: Payer: Self-pay | Admitting: Urology

## 2023-02-01 ENCOUNTER — Other Ambulatory Visit: Payer: Self-pay | Admitting: Urology

## 2023-04-18 ENCOUNTER — Other Ambulatory Visit: Payer: Self-pay

## 2023-04-18 ENCOUNTER — Other Ambulatory Visit (INDEPENDENT_AMBULATORY_CARE_PROVIDER_SITE_OTHER): Payer: BC Managed Care – PPO

## 2023-04-18 ENCOUNTER — Encounter: Payer: Self-pay | Admitting: Orthopedic Surgery

## 2023-04-18 ENCOUNTER — Ambulatory Visit: Payer: BC Managed Care – PPO | Admitting: Orthopedic Surgery

## 2023-04-18 DIAGNOSIS — M79604 Pain in right leg: Secondary | ICD-10-CM

## 2023-04-18 DIAGNOSIS — M1711 Unilateral primary osteoarthritis, right knee: Secondary | ICD-10-CM | POA: Diagnosis not present

## 2023-04-18 DIAGNOSIS — M7061 Trochanteric bursitis, right hip: Secondary | ICD-10-CM

## 2023-04-18 MED ORDER — LIDOCAINE HCL 1 % IJ SOLN
5.0000 mL | INTRAMUSCULAR | Status: AC | PRN
Start: 2023-04-18 — End: 2023-04-18
  Administered 2023-04-18: 5 mL

## 2023-04-18 MED ORDER — BUPIVACAINE HCL 0.25 % IJ SOLN
4.0000 mL | INTRAMUSCULAR | Status: AC | PRN
Start: 2023-04-18 — End: 2023-04-18
  Administered 2023-04-18: 4 mL via INTRA_ARTICULAR

## 2023-04-18 MED ORDER — METHYLPREDNISOLONE ACETATE 80 MG/ML IJ SUSP
80.0000 mg | INTRAMUSCULAR | Status: AC | PRN
Start: 2023-04-18 — End: 2023-04-18
  Administered 2023-04-18: 80 mg via INTRA_ARTICULAR

## 2023-04-18 MED ORDER — METHYLPREDNISOLONE ACETATE 40 MG/ML IJ SUSP
40.0000 mg | INTRAMUSCULAR | Status: AC | PRN
Start: 2023-04-18 — End: 2023-04-18
  Administered 2023-04-18: 40 mg via INTRA_ARTICULAR

## 2023-04-18 NOTE — Progress Notes (Signed)
Office Visit Note   Patient: Christopher Frederick           Date of Birth: 12/08/1970           MRN: [id number] Visit Date: 04/18/2023 Requested by: Ronnald Nian, MD [address] [place],  [place] [address] PCP: Ronnald Nian, MD  Subjective: Chief Complaint  Patient presents with   Right Leg - Pain    HPI: Christopher Frederick is a 52 y.o. male who presents to the office reporting right knee and leg pain.  Has had prior arthroscopic lateral meniscectomy of the right knee about 4 years ago.  For several months the knee has been feeling "fat and thick".  Still does a lot of walking.  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  Has good and bad days.  Also reports some hip pain on the lateral aspect of the right hip.  Not too much pain in the groin.  Pain wakes him from sleep at night most nights.  Does describe tenderness to touch in the trochanteric region.  He also has a history of bulging disc on the right-hand side which in the past has received epidural steroid injections but not lately this year.  Has had extensive physical therapy and treatment with manual therapy and chiropractic treatments for iliotibial band tightness.  After he gets up when the hip hurts him at night he takes the medication he walks around and the pain eventually eases.  Does not report much in the way of numbness and tingling or radicular symptoms extending below the knee.  He does take over-the-counter medications daily for this problem including ibuprofen and Tylenol..                ROS: All systems reviewed are negative as they relate to the chief complaint within the history of present illness.  Patient denies fevers or chills.  Assessment & Plan: Visit Diagnoses:  1. Pain in right leg   2. Trochanteric bursitis, right hip     Plan: Impression is fairly normal right hip exam for arthritis.  Range of motion is symmetric with no groin pain.  However he does have discrete trochanteric tenderness  and no abductor weakness.  I think he likely has some trochanteric bursitis.  Ultrasound-guided injection performed in this region today.  I think he also has some progressive right knee lateral compartment arthritis with mild effusion present today.  Aspiration and injection performed for this.  Would like to get MRI of the pelvis to evaluate the right hip joint and the surrounding muscles for pathologic process.  If negative we may want to consider a relook at his back.  Follow-up after MRI pelvis to assess efficacy of the injections as well as the plan for further MRI scanning of the back if needed.  Follow-Up Instructions: No follow-ups on file.   Orders:  Orders Placed This Encounter  Procedures   XR KNEE 3 VIEW RIGHT   XR HIP UNILAT W OR W/O PELVIS 2-3 VIEWS RIGHT   XR Lumbar Spine 2-3 Views   US Guided Needle Placement - No Linked Charges   No orders of the defined types were placed in this encounter.     Procedures: Large Joint Inj: R knee on 04/18/2023 6:57 PM Indications: diagnostic evaluation, joint swelling and pain Details: 18 G 1.5 in needle, superolateral approach  Arthrogram: No  Medications: 5 mL lidocaine 1 %; 40 mg methylPREDNISolone acetate 40 MG/ML; 4 mL bupivacaine 0.25 % Outcome:  tolerated well, no immediate complications Procedure, treatment alternatives, risks and benefits explained, specific risks discussed. Consent was given by the patient. Immediately prior to procedure a time out was called to verify the correct patient, procedure, equipment, support staff and site/side marked as required. Patient was prepped and draped in the usual sterile fashion.    Large Joint Inj: R greater trochanter on 04/18/2023 6:58 PM Indications: pain and diagnostic evaluation Details: 22 G 3.5 in needle, ultrasound-guided lateral approach  Arthrogram: No  Medications: 5 mL lidocaine 1 %; 80 mg methylPREDNISolone acetate 80 MG/ML; 4 mL bupivacaine 0.25 % Outcome: tolerated  well, no immediate complications Procedure, treatment alternatives, risks and benefits explained, specific risks discussed. Consent was given by the patient. Immediately prior to procedure a time out was called to verify the correct patient, procedure, equipment, support staff and site/side marked as required. Patient was prepped and draped in the usual sterile fashion.       Clinical Data: No additional findings.  Objective: Vital Signs: There were no vitals taken for this visit.  Physical Exam:  Constitutional: Patient appears well-developed HEENT:  Head: Normocephalic Eyes:EOM are normal Neck: Normal range of motion Cardiovascular: Normal rate Pulmonary/chest: Effort normal Neurologic: Patient is alert Skin: Skin is warm Psychiatric: Patient has normal mood and affect  Ortho Exam: Ortho exam demonstrates normal gait alignment.  5 out of 5 ankle dorsiflexion plantarflexion leg extension and flexion as well as hip flexion abduction and abduction.  No paresthesias L1 S1 bilaterally.  No groin pain on either side with internal or external rotation of the leg.  Mild to moderate effusion right knee no effusion left knee.  Collateral and cruciate ligaments are stable.  Mild patellofemoral crepitus is present bilaterally.  Some tenderness over the iliotibial band and lateral femoral condyle.  Discrete tenderness over the trochanteric region on the right compared to the left.  Specialty Comments:  No specialty comments available.  Imaging: No results found.   PMFS History: There are no problems to display for this patient.  Past Medical History:  Diagnosis Date   Asthma    as a child   Macular degeneration    Retinal tear of left eye    Urinary urgency    Takes Ditropan    Family History  Problem Relation Age of Onset   Crohn's disease Father     Past Surgical History:  Procedure Laterality Date   CATARACT EXTRACTION, BILATERAL Bilateral    left in may 2021, right   10/21   EYE SURGERY Left    EYE SURGERY Left 01/2020   sewed a lens in the eye-cataract surgery   GAS INSERTION Left 12/18/2014   Procedure: INSERTION OF GAS;  Surgeon: Carmela Rima, MD;  Location: [facility] OR;  Service: Ophthalmology;  Laterality: Left;  SF6   GAS/FLUID EXCHANGE Left 12/18/2014   Procedure: GAS/FLUID EXCHANGE;  Surgeon: Carmela Rima, MD;  Location: [facility] OR;  Service: Ophthalmology;  Laterality: Left;   KNEE SURGERY Right 08/2020   arthroscopy   PARS PLANA VITRECTOMY Left 12/18/2014   Procedure: VITRECTOMY 25 GAUGE WITH REMOVAL OF SCLERAL BUCKLE;  Surgeon: Carmela Rima, MD;  Location: [facility] OR;  Service: Ophthalmology;  Laterality: Left;   PARS PLANA VITRECTOMY Right 11/15/2020   Procedure: PARS PLANA VITRECTOMY WITH 25 GAUGE, ENDOLASER, AND GAS;  Surgeon: Carmela Rima, MD;  Location: [facility] OR;  Service: Ophthalmology;  Laterality: Right;   PERFLUORONE INJECTION Left 12/18/2014   Procedure: PERFLUORONE INJECTION;  Surgeon: Carmela Rima, MD;  Location:  [facility] OR;  Service: Ophthalmology;  Laterality: Left;   Social History   Occupational History   Not on file  Tobacco Use   Smoking status: Never   Smokeless tobacco: Never  Substance and Sexual Activity   Alcohol use: Yes    Alcohol/week: 6.0 standard drinks of alcohol    Types: 6 Glasses of wine per week    Comment: occasionally   Drug use: No   Sexual activity: Not on file

## 2023-04-19 ENCOUNTER — Other Ambulatory Visit: Payer: Self-pay

## 2023-04-19 DIAGNOSIS — M79604 Pain in right leg: Secondary | ICD-10-CM

## 2023-05-25 ENCOUNTER — Ambulatory Visit: Payer: BC Managed Care – PPO | Admitting: Orthopedic Surgery

## 2023-05-25 ENCOUNTER — Encounter: Payer: Self-pay | Admitting: Orthopedic Surgery

## 2023-05-25 ENCOUNTER — Telehealth: Payer: Self-pay

## 2023-05-25 DIAGNOSIS — M25561 Pain in right knee: Secondary | ICD-10-CM

## 2023-05-25 NOTE — Telephone Encounter (Signed)
Auth needed for right knee gel

## 2023-05-25 NOTE — Progress Notes (Signed)
Office Visit Note   Patient: Christopher Frederick           Date of Birth: February 16, 1971           MRN: [id number] Visit Date: 05/25/2023 Requested by: Ronnald Nian, MD [address] [place],  [place] [address] PCP: Ronnald Nian, MD  Subjective: Chief Complaint  Patient presents with   Right Leg - Pain    HPI: Jemarcus Meaker is a 52 y.o. male who presents to the office reporting continued right knee and lateral right hip pain.  He did have a right knee aspiration and injection which helped him a lot for 10 days but now his symptoms have recurred.  He states that his knee is back to feeling "thick and fat".  Not quite as bad this morning.  In general he walks about 20,000 steps a day when [place] is on the golf [place].  Lateral pain is worse than medial pain.  Stiffness reported after sitting.  Takes over-the-counter Advil and Tylenol for symptoms and does not really report much in the way of mechanical symptoms.  Patient also reports that his hip bursa injection helped and he denies any groin pain but that is still bothering him as well.  Was scheduled for MRI of the lumbar spine but has not been done yet..                ROS: All systems reviewed are negative as they relate to the chief complaint within the history of present illness.  Patient denies fevers or chills.  Assessment & Plan: Visit Diagnoses:  1. Right knee pain, unspecified chronicity     Plan: Impression is right knee recurrent effusion with likely nonradiographic arthritis present.  Having some medial sided symptoms as well as lateral sided symptoms.  Patient has failed nonoperative treatment.  MRI indicated to evaluate for occult arthritis versus structural problem in the knee.  Regarding his hip and back I think MRI lumbar spine indicated to rule out radicular nature of his pain.  We also would like to preapproved gel injection for the right knee. This patient is diagnosed with osteoarthritis of the  knee(s).    Radiographs show evidence of joint space narrowing, osteophytes, subchondral sclerosis and/or subchondral cysts.  This patient has knee pain which interferes with functional and activities of daily living.    This patient has experienced inadequate response, adverse effects and/or intolerance with conservative treatments such as acetaminophen, NSAIDS, topical creams, physical therapy or regular exercise, knee bracing and/or weight loss.   This patient has experienced inadequate response or has a contraindication to intra articular steroid injections for at least 3 months.   This patient is not scheduled to have a total knee replacement within 6 months of starting treatment with viscosupplementation.   Follow-Up Instructions: No follow-ups on file.   Orders:  Orders Placed This Encounter  Procedures   MR Knee Right w/o contrast   No orders of the defined types were placed in this encounter.     Procedures: No procedures performed   Clinical Data: No additional findings.  Objective: Vital Signs: There were no vitals taken for this visit.  Physical Exam:  Constitutional: Patient appears well-developed HEENT:  Head: Normocephalic Eyes:EOM are normal Neck: Normal range of motion Cardiovascular: Normal rate Pulmonary/chest: Effort normal Neurologic: Patient is alert Skin: Skin is warm Psychiatric: Patient has normal mood and affect  Ortho Exam: Ortho exam demonstrates mild effusion right knee with full range of  motion.  Collateral crucial ligaments are stable.  Alignment intact.  Not too much valgus compared to the left-hand side.  No groin pain with internal/external rotation of either leg.  Hip abduction adduction and flexion strength is intact and symmetric bilaterally.  Gait is normal.  Specialty Comments:  No specialty comments available.  Imaging: No results found.   PMFS History: There are no problems to display for this patient.  Past Medical  History:  Diagnosis Date   Asthma    as a child   Macular degeneration    Retinal tear of left eye    Urinary urgency    Takes Ditropan    Family History  Problem Relation Age of Onset   Crohn's disease Father     Past Surgical History:  Procedure Laterality Date   CATARACT EXTRACTION, BILATERAL Bilateral    left in may 2021, right  10/21   EYE SURGERY Left    EYE SURGERY Left 01/2020   sewed a lens in the eye-cataract surgery   GAS INSERTION Left 12/18/2014   Procedure: INSERTION OF GAS;  Surgeon: Carmela Rima, MD;  Location: [facility] OR;  Service: Ophthalmology;  Laterality: Left;  SF6   GAS/FLUID EXCHANGE Left 12/18/2014   Procedure: GAS/FLUID EXCHANGE;  Surgeon: Carmela Rima, MD;  Location: [facility] OR;  Service: Ophthalmology;  Laterality: Left;   KNEE SURGERY Right 08/2020   arthroscopy   PARS PLANA VITRECTOMY Left 12/18/2014   Procedure: VITRECTOMY 25 GAUGE WITH REMOVAL OF SCLERAL BUCKLE;  Surgeon: Carmela Rima, MD;  Location: [facility] OR;  Service: Ophthalmology;  Laterality: Left;   PARS PLANA VITRECTOMY Right 11/15/2020   Procedure: PARS PLANA VITRECTOMY WITH 25 GAUGE, ENDOLASER, AND GAS;  Surgeon: Carmela Rima, MD;  Location: [facility] OR;  Service: Ophthalmology;  Laterality: Right;   PERFLUORONE INJECTION Left 12/18/2014   Procedure: PERFLUORONE INJECTION;  Surgeon: Carmela Rima, MD;  Location: [facility] OR;  Service: Ophthalmology;  Laterality: Left;   Social History   Occupational History   Not on file  Tobacco Use   Smoking status: Never   Smokeless tobacco: Never  Substance and Sexual Activity   Alcohol use: Yes    Alcohol/week: 6.0 standard drinks of alcohol    Types: 6 Glasses of wine per week    Comment: occasionally   Drug use: No   Sexual activity: Not on file

## 2023-05-27 NOTE — Telephone Encounter (Signed)
VOB submitted for Orthovisc, right knee

## 2023-06-21 ENCOUNTER — Ambulatory Visit
Admission: RE | Admit: 2023-06-21 | Discharge: 2023-06-21 | Disposition: A | Discharge status: Home / Self Care | Payer: BC Managed Care – PPO | Source: Ambulatory Visit | Attending: Orthopedic Surgery | Admitting: Orthopedic Surgery

## 2023-06-21 DIAGNOSIS — M79604 Pain in right leg: Secondary | ICD-10-CM

## 2023-06-21 DIAGNOSIS — M25561 Pain in right knee: Secondary | ICD-10-CM

## 2023-07-06 ENCOUNTER — Ambulatory Visit: Payer: BC Managed Care – PPO | Admitting: Orthopedic Surgery

## 2023-07-06 ENCOUNTER — Encounter: Payer: Self-pay | Admitting: Radiology

## 2023-07-06 DIAGNOSIS — M1711 Unilateral primary osteoarthritis, right knee: Secondary | ICD-10-CM

## 2023-07-06 DIAGNOSIS — M545 Low back pain, unspecified: Secondary | ICD-10-CM

## 2023-07-06 NOTE — Progress Notes (Unsigned)
PA submitted to [employer], Orthovisc. R knee.

## 2023-07-07 ENCOUNTER — Encounter: Payer: Self-pay | Admitting: Orthopedic Surgery

## 2023-07-08 ENCOUNTER — Other Ambulatory Visit: Payer: Self-pay

## 2023-07-08 DIAGNOSIS — M1711 Unilateral primary osteoarthritis, right knee: Secondary | ICD-10-CM

## 2023-07-08 DIAGNOSIS — M25561 Pain in right knee: Secondary | ICD-10-CM

## 2023-07-10 ENCOUNTER — Encounter: Payer: Self-pay | Admitting: Orthopedic Surgery

## 2023-07-10 DIAGNOSIS — M1711 Unilateral primary osteoarthritis, right knee: Secondary | ICD-10-CM | POA: Diagnosis not present

## 2023-07-10 MED ORDER — HYALURONAN 30 MG/2ML IX SOSY
30.0000 mg | PREFILLED_SYRINGE | INTRA_ARTICULAR | Status: AC | PRN
Start: 2023-07-10 — End: 2023-07-10
  Administered 2023-07-10: 30 mg via INTRA_ARTICULAR

## 2023-07-10 MED ORDER — LIDOCAINE HCL 1 % IJ SOLN
5.0000 mL | INTRAMUSCULAR | Status: AC | PRN
  Administered 2023-07-10: 5 mL

## 2023-07-10 NOTE — Progress Notes (Signed)
Office Visit Note   Patient: Christopher Frederick           Date of Birth: 05-17-1971           MRN: [id number] Visit Date: 07/06/2023 Requested by: Ronnald Nian, MD [address] [place],  [place] [address] PCP: Ronnald Nian, MD  Subjective: Chief Complaint  Patient presents with   Other    Review scans    HPI: Christopher Frederick is a 52 y.o. male who presents to the office reporting right knee and pelvis pain.  Since he was last seen has had an MRI scan of the right knee and pelvis.  Pelvis MRI shows no hip fracture dislocation or AVN.  No issue with the hip joint and any arthritis.  No inguinal hernia is visible.  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  MRI scan of the knee shows progressive arthritis with extensive full-thickness cartilage loss of the lateral femoral tibial component with a little bit of subchondral marrow edema which has progressed compared to 3 years ago.              ROS: All systems reviewed are negative as they relate to the chief complaint within the history of present illness.  Patient denies fevers or chills.  Assessment & Plan: Visit Diagnoses:  1. Arthritis of right knee   2. Acute bilateral low back pain, unspecified whether sciatica present     Plan: Impression is right hip pain with pretty reasonable MRI scan of the pelvis.  I think he had some temporary inflammation in that joint which has resolved with prior intervention.  The knee a bigger problem with progressive arthritis developing with full-thickness cartilage loss along both sides of the joint.  He will need knee replacement at sometime in the future.  Currently he is managing reasonably well despite the progressive arthritis present in the knee.  Cortisone helped him for several weeks but he would like to try a series of gel injections which we will start today.  Follow-up in a week for the second Orthovisc injection  Follow-Up Instructions: No  follow-ups on file.   Orders:  No orders of the defined types were placed in this encounter.  No orders of the defined types were placed in this encounter.     Procedures: Large Joint Inj: R knee on 07/10/2023 11:30 AM Indications: diagnostic evaluation, joint swelling and pain Details: 18 G 1.5 in needle, superolateral approach  Arthrogram: No  Medications: 5 mL lidocaine 1 %; 30 mg Hyaluronan 30 MG/2ML Outcome: tolerated well, no immediate complications Procedure, treatment alternatives, risks and benefits explained, specific risks discussed. Consent was given by the patient. Immediately prior to procedure a time out was called to verify the correct patient, procedure, equipment, support staff and site/side marked as required. Patient was prepped and draped in the usual sterile fashion.    [id number]   Clinical Data: No additional findings.  Objective: Vital Signs: There were no vitals taken for this visit.  Physical Exam:  Constitutional: Patient appears well-developed HEENT:  Head: Normocephalic Eyes:EOM are normal Neck: Normal range of motion Cardiovascular: Normal rate Pulmonary/chest: Effort normal Neurologic: Patient is alert Skin: Skin is warm Psychiatric: Patient has normal mood and affect  Ortho Exam: Ortho exam demonstrates slight valgus alignment of that right leg.  Mild effusion is present.  Extensor mechanism intact.  Lateral greater than medial joint line tenderness is present.  No groin pain on the  right with internal extra rotation of the leg and no nerve root tension signs.  Gait is normal.  Specialty Comments:  No specialty comments available.  Imaging: No results found.   PMFS History: There are no problems to display for this patient.  Past Medical History:  Diagnosis Date   Asthma    as a child   Macular degeneration    Retinal tear of left eye    Urinary urgency    Takes Ditropan    Family History  Problem Relation Age of Onset    Crohn's disease Father     Past Surgical History:  Procedure Laterality Date   CATARACT EXTRACTION, BILATERAL Bilateral    left in may 2021, right  10/21   EYE SURGERY Left    EYE SURGERY Left 01/2020   sewed a lens in the eye-cataract surgery   GAS INSERTION Left 12/18/2014   Procedure: INSERTION OF GAS;  Surgeon: Carmela Rima, MD;  Location: [facility] OR;  Service: Ophthalmology;  Laterality: Left;  SF6   GAS/FLUID EXCHANGE Left 12/18/2014   Procedure: GAS/FLUID EXCHANGE;  Surgeon: Carmela Rima, MD;  Location: [facility] OR;  Service: Ophthalmology;  Laterality: Left;   KNEE SURGERY Right 08/2020   arthroscopy   PARS PLANA VITRECTOMY Left 12/18/2014   Procedure: VITRECTOMY 25 GAUGE WITH REMOVAL OF SCLERAL BUCKLE;  Surgeon: Carmela Rima, MD;  Location: [facility] OR;  Service: Ophthalmology;  Laterality: Left;   PARS PLANA VITRECTOMY Right 11/15/2020   Procedure: PARS PLANA VITRECTOMY WITH 25 GAUGE, ENDOLASER, AND GAS;  Surgeon: Carmela Rima, MD;  Location: [facility] OR;  Service: Ophthalmology;  Laterality: Right;   PERFLUORONE INJECTION Left 12/18/2014   Procedure: PERFLUORONE INJECTION;  Surgeon: Carmela Rima, MD;  Location: [facility] OR;  Service: Ophthalmology;  Laterality: Left;   Social History   Occupational History   Not on file  Tobacco Use   Smoking status: Never   Smokeless tobacco: Never  Substance and Sexual Activity   Alcohol use: Yes    Alcohol/week: 6.0 standard drinks of alcohol    Types: 6 Glasses of wine per week    Comment: occasionally   Drug use: No   Sexual activity: Not on file

## 2023-07-13 ENCOUNTER — Encounter: Payer: Self-pay | Admitting: Orthopedic Surgery

## 2023-07-13 ENCOUNTER — Ambulatory Visit: Payer: BC Managed Care – PPO | Admitting: Orthopedic Surgery

## 2023-07-13 DIAGNOSIS — M1711 Unilateral primary osteoarthritis, right knee: Secondary | ICD-10-CM | POA: Diagnosis not present

## 2023-07-13 NOTE — Progress Notes (Signed)
   Procedure Note  Patient: Christopher Frederick             Date of Birth: [birth year]           MRN: [id number]             Visit Date: 07/13/2023  Procedures: Visit Diagnoses:  1. Arthritis of right knee     Large Joint Inj: R knee on 07/13/2023 10:10 AM Indications: diagnostic evaluation, joint swelling and pain Details: 18 G 1.5 in needle, superolateral approach  Arthrogram: No  Medications: 5 mL lidocaine 1 %; 30 mg Hyaluronan 30 MG/2ML Outcome: tolerated well, no immediate complications Procedure, treatment alternatives, risks and benefits explained, specific risks discussed. Consent was given by the patient. Immediately prior to procedure a time out was called to verify the correct patient, procedure, equipment, support staff and site/side marked as required. Patient was prepped and draped in the usual sterile fashion.

## 2023-07-16 MED ORDER — LIDOCAINE HCL 1 % IJ SOLN
5.0000 mL | INTRAMUSCULAR | Status: AC | PRN
  Administered 2023-07-13: 5 mL

## 2023-07-16 MED ORDER — HYALURONAN 30 MG/2ML IX SOSY
30.0000 mg | PREFILLED_SYRINGE | INTRA_ARTICULAR | Status: AC | PRN
  Administered 2023-07-13: 30 mg via INTRA_ARTICULAR

## 2023-07-20 ENCOUNTER — Ambulatory Visit: Payer: BC Managed Care – PPO | Admitting: Surgical

## 2023-07-20 ENCOUNTER — Encounter: Payer: Self-pay | Admitting: Surgical

## 2023-07-20 DIAGNOSIS — M1711 Unilateral primary osteoarthritis, right knee: Secondary | ICD-10-CM

## 2023-07-20 MED ORDER — HYALURONAN 30 MG/2ML IX SOSY
30.0000 mg | PREFILLED_SYRINGE | INTRA_ARTICULAR | Status: AC | PRN
  Administered 2023-07-20: 30 mg via INTRA_ARTICULAR

## 2023-07-20 MED ORDER — LIDOCAINE HCL 1 % IJ SOLN
5.0000 mL | INTRAMUSCULAR | Status: AC | PRN
  Administered 2023-07-20: 5 mL

## 2023-07-20 NOTE — Progress Notes (Signed)
   Procedure Note  Patient: Christopher Frederick             Date of Birth: Jan 12, 1971           MRN: [id number]             Visit Date: 07/20/2023  Procedures: Visit Diagnoses: No diagnosis found.  Large Joint Inj: R knee on 07/20/2023 1:01 PM Indications: diagnostic evaluation, joint swelling and pain Details: 18 G 1.5 in needle, superolateral approach  Arthrogram: No  Medications: 5 mL lidocaine 1 %; 30 mg Hyaluronan 30 MG/2ML Outcome: tolerated well, no immediate complications Procedure, treatment alternatives, risks and benefits explained, specific risks discussed. Consent was given by the patient. Immediately prior to procedure a time out was called to verify the correct patient, procedure, equipment, support staff and site/side marked as required. Patient was prepped and draped in the usual sterile fashion.

## 2023-08-07 ENCOUNTER — Encounter: Payer: Self-pay | Admitting: Orthopedic Surgery

## 2023-08-12 ENCOUNTER — Other Ambulatory Visit: Payer: Self-pay | Admitting: Surgical

## 2023-08-12 MED ORDER — DICLOFENAC SODIUM 75 MG PO TBEC: 75.0000 mg | DELAYED_RELEASE_TABLET | Freq: Two times a day (BID) | ORAL | 0 refills | Status: AC

## 2023-09-20 ENCOUNTER — Other Ambulatory Visit (HOSPITAL_COMMUNITY): Payer: Self-pay | Admitting: Family Medicine

## 2023-09-20 DIAGNOSIS — E7800 Pure hypercholesterolemia, unspecified: Secondary | ICD-10-CM

## 2023-09-28 ENCOUNTER — Ambulatory Visit (HOSPITAL_COMMUNITY)
Admission: RE | Admit: 2023-09-28 | Discharge: 2023-09-28 | Disposition: A | Discharge status: Home / Self Care | Payer: Self-pay | Source: Ambulatory Visit | Attending: Family Medicine | Admitting: Family Medicine

## 2023-09-28 DIAGNOSIS — E7800 Pure hypercholesterolemia, unspecified: Secondary | ICD-10-CM | POA: Insufficient documentation

## 2023-11-01 ENCOUNTER — Encounter: Payer: Self-pay | Admitting: Internal Medicine

## 2024-07-09 ENCOUNTER — Encounter: Payer: Self-pay | Admitting: Radiology

## 2024-08-06 ENCOUNTER — Other Ambulatory Visit: Payer: Self-pay

## 2024-08-06 ENCOUNTER — Ambulatory Visit: Admitting: Orthopedic Surgery

## 2024-08-06 DIAGNOSIS — M25561 Pain in right knee: Secondary | ICD-10-CM

## 2024-08-06 DIAGNOSIS — M1711 Unilateral primary osteoarthritis, right knee: Secondary | ICD-10-CM | POA: Diagnosis not present

## 2024-08-06 DIAGNOSIS — M25551 Pain in right hip: Secondary | ICD-10-CM

## 2024-08-06 DIAGNOSIS — M5441 Lumbago with sciatica, right side: Secondary | ICD-10-CM

## 2024-08-07 NOTE — Progress Notes (Unsigned)
 Office Visit Note   Patient: Christopher Frederick           Date of Birth: [birth year]           MRN: [id number] Visit Date: 08/06/2024 Requested by: Joyce Norleen BROCKS, MD [address] [place],  [place] [address] PCP: Joyce Norleen BROCKS, MD  Subjective: Chief Complaint  Patient presents with   Right Leg - Pain    HPI: Christopher Frederick is a 54 y.o. male who presents to the office reporting right trochanteric pain as well as right knee pain.  Also having little bit of back pain which is chronic but improving some over the past 6 weeks.  Has not had any injections in his lumbar spine in several years.  He does have a known history of lateral compartment arthritis in the right knee as well as trochanteric bursitis in the right hip.  Has a bulging disc from prior studies on the right-hand side but that has been manageable.  However the pain going from his lateral hip region down to the knee has become more symptomatic.  Takes Tylenol  and ibuprofen daily to twice a day.  Also has cocktails in the late part of the day for this problem.  He has done a lot of stretching and a lot of nonoperative conservative treatment with the trainers and chiropractors at [facility].  He was able to walk 36 holes this summer but that has diminished some in terms of his walking endurance.  When he sits down for about 15 minutes it is hard for him to get back up.  Reports primarily lateral sided pain in the knee.  Denies any groin pain.  Does hurt for him to sleep on that right hip at times.  Trochanteric bursa injections have helped him in the past as well..                ROS: All systems reviewed are negative as they relate to the chief complaint within the history of present illness.  Patient denies fevers or chills.  Assessment & Plan: Visit Diagnoses:  1. Right knee pain, unspecified chronicity   2. Pain in right hip   3. Right-sided low back pain with right-sided sciatica, unspecified chronicity      Plan: Impression is worsening right knee arthritis as well as some trochanteric bursitis.  Will preapproved gel for the right knee and we aspirated and injected the right knee today as well.  He wants to play high-level golf tournaments when he turns 55 which will be in 2 years.  We talked little bit about knee replacement which is likely in his future at some time but that would take him about 6 to 8 months to get back into playing form after knee replacement.  We will inject the gel in the knee once this cortisone shot wears off.  Follow-Up Instructions: No follow-ups on file.   Orders:  Orders Placed This Encounter  Procedures   XR KNEE 3 VIEW RIGHT   XR HIP UNILAT W OR W/O PELVIS 2-3 VIEWS RIGHT   No orders of the defined types were placed in this encounter.     Procedures: Large Joint Inj: R knee on 08/06/2024 3:08 PM Indications: diagnostic evaluation, joint swelling and pain Details: 18 G 1.5 in needle, superolateral approach  Arthrogram: No  Medications: 5 mL lidocaine  1 %; 4 mL bupivacaine  0.25 %; 40 mg triamcinolone  acetonide 40 MG/ML Outcome: tolerated well, no immediate complications Procedure, treatment alternatives, risks  and benefits explained, specific risks discussed. Consent was given by the patient. Immediately prior to procedure a time out was called to verify the correct patient, procedure, equipment, support staff and site/side marked as required. Patient was prepped and draped in the usual sterile fashion.     This patient is diagnosed with osteoarthritis of the knee(s).    Radiographs show evidence of joint space narrowing, osteophytes, subchondral sclerosis and/or subchondral cysts.  This patient has knee pain which interferes with functional and activities of daily living.    This patient has experienced inadequate response, adverse effects and/or intolerance with conservative treatments such as acetaminophen , NSAIDS, topical creams, physical therapy or  regular exercise, knee bracing and/or weight loss.   This patient has experienced inadequate response or has a contraindication to intra articular steroid injections for at least 3 months.   This patient is not scheduled to have a total knee replacement within 6 months of starting treatment with viscosupplementation.   Clinical Data: No additional findings.  Objective: Vital Signs: There were no vitals taken for this visit.  Physical Exam:  Constitutional: Patient appears well-developed HEENT:  Head: Normocephalic Eyes:EOM are normal Neck: Normal range of motion Cardiovascular: Normal rate Pulmonary/chest: Effort normal Neurologic: Patient is alert Skin: Skin is warm Psychiatric: Patient has normal mood and affect  Ortho Exam: Ortho exam demonstrates excellent range of motion in the right knee with mild effusion about 20 cc.  No groin pain with internal/external rotation of either leg.  Does have mild trochanteric tenderness on the right compared to the left but very good hip abduction adduction and hip flexion strength.  Collateral crucial ligaments are stable on the right-hand side.  Only slight valgus alignment on the right.  Lateral joint line tenderness more than medial joint line tenderness on that side.  On the right knee collateral and cruciate ligaments are stable.  Specialty Comments:  No specialty comments available.  Imaging: No results found.   PMFS History: There are no active problems to display for this patient.  Past Medical History:  Diagnosis Date   Asthma    as a child   Macular degeneration    Retinal tear of left eye    Urinary urgency    Takes Ditropan    Family History  Problem Relation Age of Onset   Crohn's disease Father     Past Surgical History:  Procedure Laterality Date   CATARACT EXTRACTION, BILATERAL Bilateral    left in may 2021, right  10/21   EYE SURGERY Left    EYE SURGERY Left 01/2020   sewed a lens in the eye-cataract  surgery   GAS INSERTION Left 12/18/2014   Procedure: INSERTION OF GAS;  Surgeon: Onesimo Blanch, MD;  Location: [facility] OR;  Service: Ophthalmology;  Laterality: Left;  SF6   GAS/FLUID EXCHANGE Left 12/18/2014   Procedure: GAS/FLUID EXCHANGE;  Surgeon: Onesimo Blanch, MD;  Location: [facility] OR;  Service: Ophthalmology;  Laterality: Left;   KNEE SURGERY Right 08/2020   arthroscopy   PARS PLANA VITRECTOMY Left 12/18/2014   Procedure: VITRECTOMY 25 GAUGE WITH REMOVAL OF SCLERAL BUCKLE;  Surgeon: Onesimo Blanch, MD;  Location: [facility] OR;  Service: Ophthalmology;  Laterality: Left;   PARS PLANA VITRECTOMY Right 11/15/2020   Procedure: PARS PLANA VITRECTOMY WITH 25 GAUGE, ENDOLASER, AND GAS;  Surgeon: Blanch Onesimo, MD;  Location: [facility] OR;  Service: Ophthalmology;  Laterality: Right;   PERFLUORONE INJECTION Left 12/18/2014   Procedure: PERFLUORONE INJECTION;  Surgeon: Onesimo Blanch, MD;  Location:  [facility] OR;  Service: Ophthalmology;  Laterality: Left;   Social History   Occupational History   Not on file  Tobacco Use   Smoking status: Never   Smokeless tobacco: Never  Substance and Sexual Activity   Alcohol use: Yes    Alcohol/week: 6.0 standard drinks of alcohol    Types: 6 Glasses of wine per week    Comment: occasionally   Drug use: No   Sexual activity: Not on file

## 2024-08-08 ENCOUNTER — Encounter: Payer: Self-pay | Admitting: Orthopedic Surgery

## 2024-08-08 MED ORDER — BUPIVACAINE HCL 0.25 % IJ SOLN
4.0000 mL | INTRAMUSCULAR | Status: AC | PRN
  Administered 2024-08-06: 4 mL via INTRA_ARTICULAR

## 2024-08-08 MED ORDER — TRIAMCINOLONE ACETONIDE 40 MG/ML IJ SUSP
40.0000 mg | INTRAMUSCULAR | Status: AC | PRN
  Administered 2024-08-06: 40 mg via INTRA_ARTICULAR

## 2024-08-08 MED ORDER — LIDOCAINE HCL 1 % IJ SOLN
5.0000 mL | INTRAMUSCULAR | Status: AC | PRN
  Administered 2024-08-06: 5 mL
